# Patient Record
Sex: Female | Born: 1945 | Race: White | Hispanic: No | Smoking: Current every day smoker
Health system: Southern US, Community
[De-identification: ages and names within clinical notes are randomized; demographics above are authoritative.]

## PROBLEM LIST (undated history)

## (undated) DIAGNOSIS — N19 Unspecified kidney failure: Secondary | ICD-10-CM

## (undated) DIAGNOSIS — G4733 Obstructive sleep apnea (adult) (pediatric): Secondary | ICD-10-CM

## (undated) HISTORY — DX: Unspecified kidney failure: N19

## (undated) HISTORY — DX: Obstructive sleep apnea (adult) (pediatric): G47.33

---

## 2003-03-30 ENCOUNTER — Ambulatory Visit (HOSPITAL_COMMUNITY): Admission: RE | Admit: 2003-03-30 | Discharge: 2003-03-30 | Payer: Self-pay | Admitting: Neurosurgery

## 2003-03-30 ENCOUNTER — Encounter: Payer: Self-pay | Admitting: Neurosurgery

## 2003-04-11 ENCOUNTER — Encounter: Payer: Self-pay | Admitting: Diagnostic Radiology

## 2003-04-11 ENCOUNTER — Encounter: Admission: RE | Admit: 2003-04-11 | Discharge: 2003-04-11 | Payer: Self-pay | Admitting: Neurosurgery

## 2003-04-11 ENCOUNTER — Encounter: Payer: Self-pay | Admitting: Neurosurgery

## 2003-07-17 ENCOUNTER — Inpatient Hospital Stay (HOSPITAL_COMMUNITY): Admission: RE | Admit: 2003-07-17 | Discharge: 2003-07-20 | Payer: Self-pay | Admitting: Neurosurgery

## 2003-10-23 ENCOUNTER — Inpatient Hospital Stay (HOSPITAL_COMMUNITY): Admission: RE | Admit: 2003-10-23 | Discharge: 2003-10-30 | Payer: Self-pay | Admitting: Neurosurgery

## 2004-07-09 ENCOUNTER — Ambulatory Visit: Payer: Self-pay | Admitting: Oncology

## 2004-08-21 ENCOUNTER — Ambulatory Visit (HOSPITAL_COMMUNITY): Admission: RE | Admit: 2004-08-21 | Discharge: 2004-08-21 | Payer: Self-pay | Admitting: Neurosurgery

## 2005-04-02 IMAGING — CT CT CERVICAL SPINE W/ CM
1 of 7 series · 3 of 14 positions shown, 4 images · non-contrast
Comparison: none

CLINICAL DATA: Neck pain.  
CERVICAL MYELOGRAM:
Lumbar puncture was performed by Dr. Medrano at approximately L4-5.  There was a mixed injection with subdural and subarachnoid contrast.  There was insufficient contrast maneuvered into the cervical region to allow for diagnostic myelography.

[Series 3: recon 2: cervical spine · axial · 0.23mm/px · z∈[-36,+114]mm · 3 of 61 slices shown, 4 images]
[im 1/61  soft-tissue]
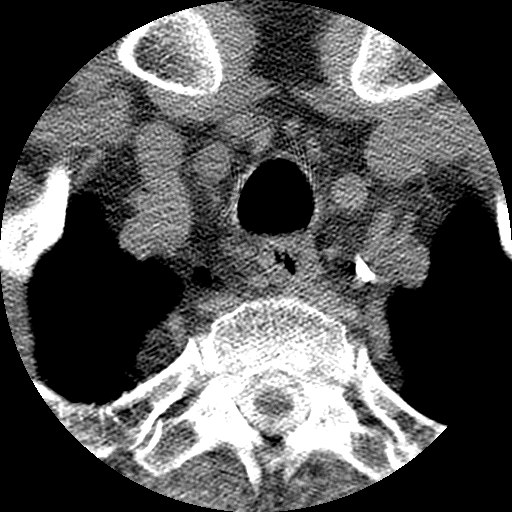
[im 1/61  bone]
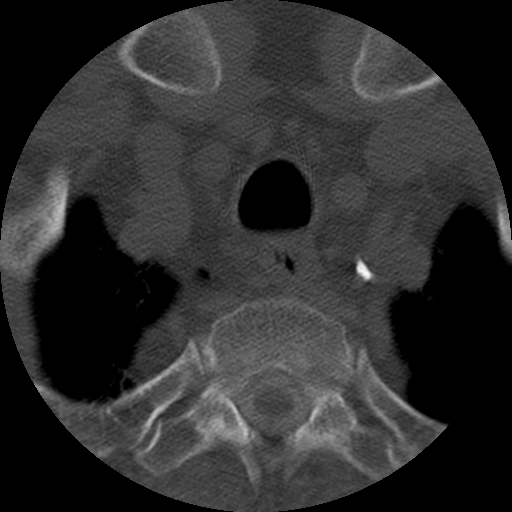
[im 31/61  bone]
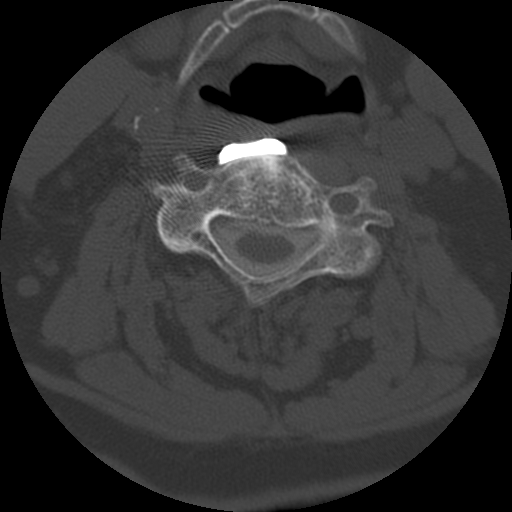
[im 61/61  bone]
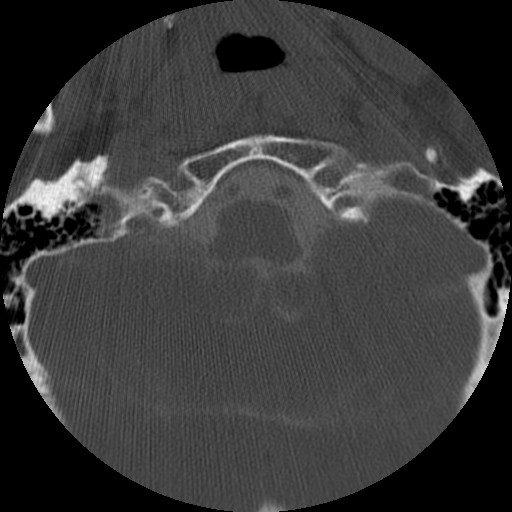

[3 of 14 positions shown; findings below may reference images not displayed]

IMPRESSION: As above. 
POST MYELOGRAM CT OF THE CERVICAL SPINE:
Hardware has been place anteriorly to augment several levels of fusion.  There is no apparent loosening of the screws.
C2-3:  Normal interspace. 
C3-4:  Incomplete fusion. Foraminal narrowing, left with left C4 nerve root encroachment.  
C4-5:  Solid fusion.  Bony foraminal narrowing bilaterally left greater than right with bilateral C5 nerve root encroachment worse on the left. 
C5-6:  Bony foraminal narrowing due to a combination of facet arthropathy, osteophyte formation.  Solid fusion is present. Bilateral C6 nerve root encroachment is present. 
C6-7:   Nonunion across the fusion site.  Bilateral uncinate hypertrophy results in foraminal narrowing with resultant C7 nerve root encroachment.  
C7-T1:  No stenosis or disk protrusion. 
Canal diameter is adequate at all levels without cord compression or spinal stenosis.
IMPRESSION: 1.   The dominant findings are nonunion at C3-4 and C6-7 despite anterior hardware placement.  
2.   Bony fusion is seen across the C4-5 and C5-6 interspaces.  
3.  There is multilevel bony foraminal narrowing due to uncinate hypertrophy and osteophyte formation as well as facet arthropathy; this is worst at C3-4 on the left, C4-5, C5-6 and C6-7 bilaterally.  
4.  There is no cord compression or significant soft disk protrusion.

## 2007-08-05 ENCOUNTER — Encounter: Admission: RE | Admit: 2007-08-05 | Discharge: 2007-08-05 | Payer: Self-pay

## 2010-08-30 ENCOUNTER — Encounter: Payer: Self-pay | Admitting: Neurosurgery

## 2010-12-26 NOTE — H&P (Signed)
NAMEPRESLEIGH, FELDSTEIN                           ACCOUNT NO.:  [id number]   MEDICAL RECORD NO.:  [id number]                   PATIENT TYPE:  INP   LOCATION:  3035                                 FACILITY:  [facility]   PHYSICIAN:  Hilda Lias, M.D.                DATE OF BIRTH:  18-Sep-1945   DATE OF ADMISSION:  07/17/2003  DATE OF DISCHARGE:                                HISTORY & PHYSICAL   Ms. Votaw is a lady who was in my office because of leg pain going from her  shoulder all the way down the right upper extremity associated with numbness  and tingling sensation.  This problem had been going on for at least three  years since she fell.  About 10 years ago, she underwent cervical diskectomy  by a neurosurgeon in [facility].  She feels that she is getting worse.  She  has had conservative treatment.  Because of no improvement, she wants to  proceed with surgery.   PAST MEDICAL HISTORY:  1. She had two lumbar disks removed in [place], [place].  2. Neck surgery in [place], [place].  3. C section.   ALLERGIES:  DAYPRO and TYLENOL.   SOCIAL HISTORY:  She smokes two packs a day.  She does not drink.   FAMILY HISTORY:  Unremarkable.   REVIEW OF SYSTEMS:  Positive for headache, leg pain, shortness of breath,  urinary incontinence, arm weakness, leg weakness, neck pain.   PHYSICAL EXAMINATION:  GENERAL:  The patient came to my office, and she was  in no acute distress.  HEENT:  Normal.  NECK:  Scar on the right side of the neck.  She is able to flex. Extension  causes pain because of restriction  LUNGS:  Bilateral rhonchi.  HEART:  Sounds normal.  ABDOMEN:  Normal.  EXTREMITIES:  Normal pulses.  NEUROLOGIC:  Mental status normal.  Cranial nerves normal.  Strength: She  has weakness in dorsiflexion in both feet.  Sensation is normal.   LABORATORY DATA:  The patient had cervical myelogram which showed a case of  degenerative disk disease at the level of 4-5 and  6-7.   The last time I saw her, she was having quite a bit of worsening of pain up  to the point where she said she wanted to proceed with surgery.  The  procedure will be a 3-4, 4-5, and 6-7 diskectomy with fusion using bone  graft, allograft, plus a plate.  The patient prior to surgery had an EMG  nerve conduction test which showed chronic C5 and C6 radiculopathy.   CLINICAL IMPRESSION:  C3-4, 5-6, 6-7 degenerative disk disease with a  chronic radiculopathy.  She is status post lumbar surgery two times in  [place], [place].   RECOMMENDATIONS:  I talked to her, and she wants to proceed with the  surgery.  Because she had surgery on the  right side of the neck, I sent her  to be seen by ENT.  According to her test, her bronchial cords were  completely normal.  Nevertheless, the risks associated with surgery are  paralysis of the vocal cord, infection, CSF leak, damage to vertebral  artery, damage to the esophagus, damage to the trachea, and need for further  surgery.                                                Hilda Lias, M.D.    EB/MEDQ  D:  07/17/2003  T:  07/17/2003  Job:  [id number]

## 2010-12-26 NOTE — Op Note (Signed)
[name], [name]                           ACCOUNT NO.:  [id number]   MEDICAL RECORD NO.:  [id number]                   PATIENT TYPE:  INP   LOCATION:  3011                                 FACILITY:  [facility]   PHYSICIAN:  Hilda Lias, M.D.                DATE OF BIRTH:  1946-07-01   DATE OF PROCEDURE:  10/23/2003  DATE OF DISCHARGE:                                 OPERATIVE REPORT   PREOPERATIVE DIAGNOSES:  Degenerative disk disease, L4-5 with bilateral  radiculopathy, stenosis L5-S1, status post three lumbar laminectomies in  [place], [place].   POSTOPERATIVE DIAGNOSES:  Degenerative disk disease, L4-5 with bilateral  radiculopathy, stenosis L5-S1, status post three lumbar laminectomies in  [place], [place].   OPERATION PERFORMED:  Bilateral L4 on L5 laminectomy, bilateral total gross  L4-5 diskectomy, interbody fusion with auto and allograft, pedicle screw for  L4-L5, posterolateral fusion L4, L5, S1 autograft, allograft.  C-arm.  Cell  Saver.   SURGEON:  Hilda Lias, M.D.   ASSISTANT:  Clydene Fake, M.D.   ANESTHESIA:  General.   INDICATIONS FOR PROCEDURE:  [name] is a lady who last year underwent  anterior cervical diskectomy.  She had two procedures in [facility].  Also  she had been complaining of back pain and the patient had three lumbar  laminectomies  in [place], [place].  Also she had weakening of both  legs.  The pain was getting worse.  She wanted to have surgery back in  [place] and I agreed with that.  Nevertheless, we scheduled her for last  month.  She cancelled and now she called back because she was getting worse.  She has a 2/5 weakness for dorsiflexion.  X-ray showed a calcified disk at  the level 5-1 and degenerative arthropathy with stenosis at the L4-5.  The  risks were explained in the history and physical.   DESCRIPTION OF PROCEDURE:  The patient was taken to the operating room and  she was positioned in a  prone manner.  A previous scar tissue was removed.  Muscle and fascia were retracted laterally.  By x-ray, we identified L4-L5  and L5-S1.  Indeed the L5-S1 was calcified.  It was quite difficult to see  in the C-arm and we went ahead and did a plain x-ray.  Then with a Leksell  we removed the spinous process of L4-L5.  We did bilateral laminectomies and  decompressed the L5 nerve root.  Then we identified the L4-5 herniated disk.  The patient had quite a bit of scar tissue in the left side.  Lysis was  accomplished and we were able to decompress the L4 and L5 nerve root  bilaterally.  We entered the disk space.  Total diskectomy was made and  using curet, the end plates were removed.  A piece of allograft 12 x 24 was  inserted bilaterally and in the midline  as well as posterolaterally, auto  and allograft was used to fill up the space.  Then we with the C-arm, we  identified the spinous process, the pedicle of L4 on L5.  Pedicle probe was  inserted followed by a tap.  Then four pedicle screws of 6.5 x 45 were  introduced.  AP and lateral view showed good position of the pedicle screws.  Then we went laterally and we removed the periosteum of L4-L5, transverse  processes, as well as the ala of the sacrum.  A mixture of auto and  allograft was used to fill up this space.  Then rods  were used to connect to the pedicle screws and caps were used.  The  __________ showed good position.  Investigation of the L4, L5 and S1 nerve  roots was normal with plenty of space.  From then on, Valsalva maneuver was  negative.  The area was irrigated and the wound was closed with Vicryl and  nylon.                                               Hilda Lias, M.D.    EB/MEDQ  D:  10/23/2003  T:  10/24/2003  Job:  [id number]

## 2010-12-26 NOTE — Op Note (Signed)
NAMEJAMEL, [name]                           ACCOUNT NO.:  [id number]   MEDICAL RECORD NO.:  [id number]                   PATIENT TYPE:  INP   LOCATION:  3035                                 FACILITY:  [facility]   PHYSICIAN:  Hilda Lias, M.D.                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   PROCEDURE:  Anterior 3-4, 4-5, and 6-7 diskectomy, decompression of the  spinal cord, bilateral foraminotomy, interbody fusion with allograft plate  from C3 down to C5 and from C6 to C7.  Microscope.   SURGEON:  Hilda Lias, M.D.   ASSISTANT:  Clydene Fake, M.D.   INDICATIONS FOR PROCEDURE:  The patient was admitted because of neck pain  with radiation to the left upper extremity for several months.  The patient  previously had surgery in her neck in [place] at the level of 5-6.  Also  she had three or four back interventions.  Myelogram showed progressive  spondylosis mostly at the level of 3-4, 4-5, and 6-7, worse to the left  side.  The patient wanted to proceed with surgery.  The risks were explained  in the history and physical.  The patient is a heavy smoker.   DESCRIPTION OF PROCEDURE:  The patient was taken to the ER and we decided to  go ahead through the right side. The reason was because she had surgery from  the right side before and although she told us that she was seen by ENT  which found her vocal cord normal, I never got the report.  To avoid any  worsening of any previous vocal cord paralysis, I decided to go to the right  side.  A longitudinal incision was made through the skin and  platysma down  to the cervical area.  X-ray showed that indeed we were at the level of 4-5  and C6-7.  Then we opened the anterior ligament at the level of 3-4 and 4-5.  The patient had quite a bit of anterior osteophytes which were removed.  The  same procedure was done at the level of C6 and C7.  We brought the  microscope into the area and we found quite a bit of degenerative disk with  stenosis bilaterally worse going to the left side.  Using the 1, 2, and 3 mm  Kerrison punch, decompression of the spinal cord as well as the foramen was  done.  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  Nevertheless, we decompressed the  spinal cord and the nerve root.  Then two pieces of bone graft of 7 mm was  applied at the level of 3-4 and 4-5 followed by a plate using six screws.  At the level of C6-7 because the bone was osteoporotic, the bone graft was  about 10 mm height.  This was followed by a plate using four screws.  The  area was irrigated.  Lateral cervical spine showed that the upper plate was  normal.  It was difficult to see the lower part, although, the upper part of  the plate at the level of C6 was within normal limits.  The area was  irrigated, but because of her history of smoking and COPD, we decided to put  in a drain.  The wound was closed with Vicryl and Steri-Strips.                                               Hilda Lias, M.D.   EB/MEDQ  D:  07/17/2003  T:  07/18/2003  Job:  [id number]

## 2010-12-26 NOTE — H&P (Signed)
[name], [name]                           ACCOUNT NO.:  [id number]   MEDICAL RECORD NO.:  [id number]                   PATIENT TYPE:  INP   LOCATION:  3011                                 FACILITY:  [facility]   PHYSICIAN:  Hilda Lias, M.D.                DATE OF BIRTH:  12-06-1945   DATE OF ADMISSION:  10/23/2003  DATE OF DISCHARGE:                                HISTORY & PHYSICAL   HISTORY OF PRESENT ILLNESS:  [name] is a lady who was taken to surgery  by me for a anterior cervical diskectomy several months ago.  This patient,  in the past, has had 3 lumbar diskectomies in [place], [place].  Before and after her neck surgery, she continues to have back pain with  radiation down to both lower extremities and she was quite miserable.  Neck-  wise, she is doing great, but now she feels that the pain in both legs is  getting worse.  We scheduled her for surgery at her request back in  February, although we advised her that since she already had surgery in  [place], it would be really worthwhile to be seen by the surgeon in  [place].  Nevertheless, she cancelled her surgery in February and then she  tried to go back to work, the pain got worse and she decided to go ahead and  proceed with the lumbar fusion.  This procedure was offered to her by the  surgeon in [place].   PAST MEDICAL HISTORY:  1. She had anterior lumbar diskectomies.  2. She had neck surgery in 1992 and also in 2004 by [place].  3. The patient has a history of C-section.   ALLERGIES:  She is allergic to DAYPRO, CODEINE and TYLENOL.   SOCIAL HISTORY:  She smokes 2 packs a day.  She does not drink.   FAMILY HISTORY:  Family history unremarkable.   REVIEW OF SYSTEMS:  Review of systems positive for leg pain, sinus headache,  asthma, shortness of breath, urinary incontinence, arm weakness, leg  weakness, arthritis.   PHYSICAL EXAMINATION:  HEENT:  Head, eyes, ears, nose and throat normal.  NECK:   She has a scar from previous surgery anteriorly.  LUNGS:  There are some rhonchi bilaterally.  HEART:  Heart sounds normal.  ABDOMEN:  Normal.  EXTREMITIES:  Normal pulses.  NEUROLOGIC:  Mental status normal.  Cranial nerves normal.  The patient has  2/5 weakness on dorsiflexion of the left foot.  Reflexes are symmetrical  with decrease at the left ankle jerk.  She has some numbness in both feet.  The straight leg raising -- SLR -- is positive bilaterally at 45 degrees and  she has weakness of the left foot being 2/5.   IMAGING STUDIES:  The lumbar x-ray shows some degenerative disk disease at  the level of L4-5.   The MRI shows a severe case of degenerative  disk disease with bilateral  facet arthropathy, 4-5, right worse than the left one.   CLINICAL IMPRESSION:  1. Chronic back pain with status post three lumbar diskectomies.  2. Degenerative disease at 4-5 with a chronic L5 radiculopathy.   RECOMMENDATION:  The patient wants to proceed with surgery.  The procedure  will be an L4-5 diskectomy, interbody fusion, posterolateral fusion and  pedicle screws.  She knows about the risks such as no improvement whatsoever  because of the chronicity of the pain and the chronicity of the weakness and  CSF leak, failure of the fusion because of the history of heavy smoking,  damage to the vessels of the abdomen, damage or problems with bladder and  bowel and need for further surgery.                                                Hilda Lias, M.D.    EB/MEDQ  D:  10/23/2003  T:  10/24/2003  Job:  [id number]

## 2010-12-26 NOTE — Discharge Summary (Signed)
NAMEKRISTLE, [name]                           ACCOUNT NO.:  [id number]   MEDICAL RECORD NO.:  [id number]                   PATIENT TYPE:  INP   LOCATION:  3035                                 FACILITY:  [facility]   PHYSICIAN:  Hilda Lias, M.D.                DATE OF BIRTH:  07-09-1946   DATE OF ADMISSION:  07/17/2003  DATE OF DISCHARGE:  07/20/2003                                 DISCHARGE SUMMARY   ADMISSION DIAGNOSES:  1. C3-4, C4-5, and C6-7 spondylosis.  2. Chronic obstructive pulmonary disease.   FINAL DIAGNOSES:  1. C3-4, C4-5, and C6-7 spondylosis.  2. Chronic obstructive pulmonary disease.   HISTORY:  The patient was admitted because of neck pain with radiation to  the left lower extremity.  In the past, he has had fusion at the level of 5-  6.  X-rays show diffuse spondylosis of 3-4, 4-5, and 6-7.  Surgery was  advised.   LABORATORY DATA:  Normal.   HOSPITAL COURSE:  The patient was taken to surgery and a decompression and  fusion at those three levels was made.  Because of the previous surgery we  did this surgery through the right side, and a drain was left in for 48  hours.  Today, she is doing much better.  The patient is a heavy smoker, and  the oxygen saturations have been 86 to 92.  Today, she is ambulating, she  has no shortness of breath, she is able to swallow, and she feels that she  is ready to go home.   CONDITION ON DISCHARGE:  Improved.   DISCHARGE MEDICATIONS:  1. Percocet.  2. Flexeril.   She is to continue taking her medications for her lungs, and she was advised  to be seen by her medical physician for her lungs in the next 10 days.   DIET:  Regular.   ACTIVITY:  She is not to drive for at least a week.   FOLLOWUP:  I will see her in my office in four weeks.   Any questions, she is going to call me anytime.                                                Hilda Lias, M.D.    EB/MEDQ  D:  07/20/2003  T:  07/21/2003  Job:  [id number]

## 2010-12-26 NOTE — Discharge Summary (Signed)
[name], [name]                           ACCOUNT NO.:  [id number]   MEDICAL RECORD NO.:  [id number]                   PATIENT TYPE:  INP   LOCATION:  3011                                 FACILITY:  [facility]   PHYSICIAN:  Hilda Lias, M.D.                DATE OF BIRTH:  19-May-1946   DATE OF ADMISSION:  10/23/2003  DATE OF DISCHARGE:  10/25/2003                                 DISCHARGE SUMMARY   ADMISSION DIAGNOSIS:  L4-5 degenerative disk disease with chronic L4-L5  radiculopathy, status post 2 lumbar diskectomies.   POSTOPERATIVE DIAGNOSES:  1. L4-5 degenerative disk disease with chronic L4-L5 radiculopathy, status     post 2 lumbar diskectomies.  2. Pneumonia.   CLINICAL HISTORY:  The patient was admitted because of back pain with  radiation down to both legs.  She has chronic weakness with both  dorsiflexion.  The patient elsewhere had 2 lumbar diskectomies.  X-rays  showed degenerative disk disease at 4-5.  The patient wanted to proceed with  surgery.  The patient is a heavy smoker.   LABORATORY DATA:  Laboratory within normal limits except for the hemoglobin  is 10.0.  The white cells went from 16.1 to 7.1.   COURSE IN THE HOSPITAL:  The patient was taken to surgery and a L4-5 fusion  was done.  After surgery, the patient was drowsy and was sleepy.  She had  quite a bit of secretions.  She had a low-grade fever.  X-rays showed that  she had bilateral pneumonia.  She was seen by Dr. Hollice Espy from  the [facility]  The patient was started on antibiotics.  Today, she is feeling much better, she has been ambulating, she has been  afebrile and she wants to go home.   CONDITION ON DISCHARGE:  Improving.   MEDICATIONS:  Percocet, Flexeril and Avelox.   DIET:  She was advised to continue losing weight.   ACTIVITY:  Not to drive and not to do any heavy lifting.   FOLLOWUP:  I will be seeing her in 4 weeks or as needed.  She also was  encouraged  to call her regular doctor, Dr. Lucila Maine, in [place].  If  she has any questions in getting an appointment with him, she is going to  give me a call so I can get one of the pulmonologists to see her here in  [place].                                                Hilda Lias, M.D.    EB/MEDQ  D:  10/30/2003  T:  11/01/2003  Job:  [id number]

## 2015-04-13 DIAGNOSIS — J9601 Acute respiratory failure with hypoxia: Secondary | ICD-10-CM | POA: Diagnosis not present

## 2015-04-13 DIAGNOSIS — J180 Bronchopneumonia, unspecified organism: Secondary | ICD-10-CM | POA: Diagnosis not present

## 2015-04-13 DIAGNOSIS — E860 Dehydration: Secondary | ICD-10-CM | POA: Diagnosis not present

## 2015-04-13 DIAGNOSIS — J441 Chronic obstructive pulmonary disease with (acute) exacerbation: Secondary | ICD-10-CM | POA: Diagnosis not present

## 2015-04-16 DIAGNOSIS — Z6827 Body mass index (BMI) 27.0-27.9, adult: Secondary | ICD-10-CM | POA: Diagnosis not present

## 2015-04-16 DIAGNOSIS — J069 Acute upper respiratory infection, unspecified: Secondary | ICD-10-CM | POA: Diagnosis not present

## 2015-04-16 DIAGNOSIS — H6691 Otitis media, unspecified, right ear: Secondary | ICD-10-CM | POA: Diagnosis not present

## 2015-04-30 DIAGNOSIS — H6691 Otitis media, unspecified, right ear: Secondary | ICD-10-CM | POA: Diagnosis not present

## 2015-04-30 DIAGNOSIS — Z6828 Body mass index (BMI) 28.0-28.9, adult: Secondary | ICD-10-CM | POA: Diagnosis not present

## 2015-05-07 DIAGNOSIS — F1120 Opioid dependence, uncomplicated: Secondary | ICD-10-CM | POA: Diagnosis not present

## 2015-05-07 DIAGNOSIS — M961 Postlaminectomy syndrome, not elsewhere classified: Secondary | ICD-10-CM | POA: Diagnosis not present

## 2015-05-07 DIAGNOSIS — S32008A Other fracture of unspecified lumbar vertebra, initial encounter for closed fracture: Secondary | ICD-10-CM | POA: Diagnosis not present

## 2015-05-07 DIAGNOSIS — Z720 Tobacco use: Secondary | ICD-10-CM | POA: Diagnosis not present

## 2015-05-07 DIAGNOSIS — G894 Chronic pain syndrome: Secondary | ICD-10-CM | POA: Diagnosis not present

## 2015-05-07 DIAGNOSIS — Q761 Klippel-Feil syndrome: Secondary | ICD-10-CM | POA: Diagnosis not present

## 2015-05-09 DIAGNOSIS — H6523 Chronic serous otitis media, bilateral: Secondary | ICD-10-CM | POA: Diagnosis not present

## 2015-05-09 DIAGNOSIS — Z6828 Body mass index (BMI) 28.0-28.9, adult: Secondary | ICD-10-CM | POA: Diagnosis not present

## 2015-05-13 DIAGNOSIS — J9601 Acute respiratory failure with hypoxia: Secondary | ICD-10-CM | POA: Diagnosis not present

## 2015-05-13 DIAGNOSIS — J180 Bronchopneumonia, unspecified organism: Secondary | ICD-10-CM | POA: Diagnosis not present

## 2015-05-13 DIAGNOSIS — J441 Chronic obstructive pulmonary disease with (acute) exacerbation: Secondary | ICD-10-CM | POA: Diagnosis not present

## 2015-05-13 DIAGNOSIS — E860 Dehydration: Secondary | ICD-10-CM | POA: Diagnosis not present

## 2015-05-22 DIAGNOSIS — H8110 Benign paroxysmal vertigo, unspecified ear: Secondary | ICD-10-CM | POA: Diagnosis not present

## 2015-05-22 DIAGNOSIS — J342 Deviated nasal septum: Secondary | ICD-10-CM | POA: Diagnosis not present

## 2015-05-22 DIAGNOSIS — J3489 Other specified disorders of nose and nasal sinuses: Secondary | ICD-10-CM | POA: Diagnosis not present

## 2015-05-22 DIAGNOSIS — R42 Dizziness and giddiness: Secondary | ICD-10-CM | POA: Diagnosis not present

## 2015-05-22 DIAGNOSIS — H9193 Unspecified hearing loss, bilateral: Secondary | ICD-10-CM | POA: Diagnosis not present

## 2015-05-29 DIAGNOSIS — J449 Chronic obstructive pulmonary disease, unspecified: Secondary | ICD-10-CM | POA: Diagnosis not present

## 2015-05-29 DIAGNOSIS — Z01818 Encounter for other preprocedural examination: Secondary | ICD-10-CM | POA: Diagnosis not present

## 2015-05-29 DIAGNOSIS — N055 Unspecified nephritic syndrome with diffuse mesangiocapillary glomerulonephritis: Secondary | ICD-10-CM | POA: Diagnosis not present

## 2015-05-29 DIAGNOSIS — N183 Chronic kidney disease, stage 3 (moderate): Secondary | ICD-10-CM | POA: Diagnosis not present

## 2015-05-29 DIAGNOSIS — Z23 Encounter for immunization: Secondary | ICD-10-CM | POA: Diagnosis not present

## 2015-06-06 DIAGNOSIS — M961 Postlaminectomy syndrome, not elsewhere classified: Secondary | ICD-10-CM | POA: Diagnosis not present

## 2015-06-06 DIAGNOSIS — Q761 Klippel-Feil syndrome: Secondary | ICD-10-CM | POA: Diagnosis not present

## 2015-06-06 DIAGNOSIS — Z720 Tobacco use: Secondary | ICD-10-CM | POA: Diagnosis not present

## 2015-06-06 DIAGNOSIS — F1120 Opioid dependence, uncomplicated: Secondary | ICD-10-CM | POA: Diagnosis not present

## 2015-06-06 DIAGNOSIS — S32008A Other fracture of unspecified lumbar vertebra, initial encounter for closed fracture: Secondary | ICD-10-CM | POA: Diagnosis not present

## 2015-06-06 DIAGNOSIS — G894 Chronic pain syndrome: Secondary | ICD-10-CM | POA: Diagnosis not present

## 2015-06-12 DIAGNOSIS — J3489 Other specified disorders of nose and nasal sinuses: Secondary | ICD-10-CM | POA: Diagnosis not present

## 2015-06-12 DIAGNOSIS — Z79899 Other long term (current) drug therapy: Secondary | ICD-10-CM | POA: Diagnosis not present

## 2015-06-12 DIAGNOSIS — J343 Hypertrophy of nasal turbinates: Secondary | ICD-10-CM | POA: Diagnosis not present

## 2015-06-12 DIAGNOSIS — Z7982 Long term (current) use of aspirin: Secondary | ICD-10-CM | POA: Diagnosis not present

## 2015-06-12 DIAGNOSIS — J342 Deviated nasal septum: Secondary | ICD-10-CM | POA: Diagnosis not present

## 2015-06-12 DIAGNOSIS — J45909 Unspecified asthma, uncomplicated: Secondary | ICD-10-CM | POA: Diagnosis not present

## 2015-06-13 DIAGNOSIS — J441 Chronic obstructive pulmonary disease with (acute) exacerbation: Secondary | ICD-10-CM | POA: Diagnosis not present

## 2015-06-13 DIAGNOSIS — E860 Dehydration: Secondary | ICD-10-CM | POA: Diagnosis not present

## 2015-06-13 DIAGNOSIS — J180 Bronchopneumonia, unspecified organism: Secondary | ICD-10-CM | POA: Diagnosis not present

## 2015-06-13 DIAGNOSIS — J9601 Acute respiratory failure with hypoxia: Secondary | ICD-10-CM | POA: Diagnosis not present

## 2015-06-17 DIAGNOSIS — Z1231 Encounter for screening mammogram for malignant neoplasm of breast: Secondary | ICD-10-CM | POA: Diagnosis not present

## 2015-06-17 DIAGNOSIS — Z1211 Encounter for screening for malignant neoplasm of colon: Secondary | ICD-10-CM | POA: Diagnosis not present

## 2015-06-17 DIAGNOSIS — Z0000 Encounter for general adult medical examination without abnormal findings: Secondary | ICD-10-CM | POA: Diagnosis not present

## 2015-06-17 DIAGNOSIS — Z01419 Encounter for gynecological examination (general) (routine) without abnormal findings: Secondary | ICD-10-CM | POA: Diagnosis not present

## 2015-06-26 DIAGNOSIS — N055 Unspecified nephritic syndrome with diffuse mesangiocapillary glomerulonephritis: Secondary | ICD-10-CM | POA: Diagnosis not present

## 2015-06-26 DIAGNOSIS — Z79899 Other long term (current) drug therapy: Secondary | ICD-10-CM | POA: Diagnosis not present

## 2015-06-26 DIAGNOSIS — I119 Hypertensive heart disease without heart failure: Secondary | ICD-10-CM | POA: Diagnosis not present

## 2015-06-26 DIAGNOSIS — Z1389 Encounter for screening for other disorder: Secondary | ICD-10-CM | POA: Diagnosis not present

## 2015-06-26 DIAGNOSIS — N183 Chronic kidney disease, stage 3 (moderate): Secondary | ICD-10-CM | POA: Diagnosis not present

## 2015-06-26 DIAGNOSIS — Z139 Encounter for screening, unspecified: Secondary | ICD-10-CM | POA: Diagnosis not present

## 2015-06-26 DIAGNOSIS — Z0000 Encounter for general adult medical examination without abnormal findings: Secondary | ICD-10-CM | POA: Diagnosis not present

## 2015-07-03 DIAGNOSIS — F1120 Opioid dependence, uncomplicated: Secondary | ICD-10-CM | POA: Diagnosis not present

## 2015-07-03 DIAGNOSIS — M961 Postlaminectomy syndrome, not elsewhere classified: Secondary | ICD-10-CM | POA: Diagnosis not present

## 2015-07-03 DIAGNOSIS — S32008A Other fracture of unspecified lumbar vertebra, initial encounter for closed fracture: Secondary | ICD-10-CM | POA: Diagnosis not present

## 2015-07-03 DIAGNOSIS — G894 Chronic pain syndrome: Secondary | ICD-10-CM | POA: Diagnosis not present

## 2015-07-03 DIAGNOSIS — Z720 Tobacco use: Secondary | ICD-10-CM | POA: Diagnosis not present

## 2015-07-03 DIAGNOSIS — Q761 Klippel-Feil syndrome: Secondary | ICD-10-CM | POA: Diagnosis not present

## 2015-07-08 DIAGNOSIS — Z1231 Encounter for screening mammogram for malignant neoplasm of breast: Secondary | ICD-10-CM | POA: Diagnosis not present

## 2015-07-08 DIAGNOSIS — M7062 Trochanteric bursitis, left hip: Secondary | ICD-10-CM | POA: Diagnosis not present

## 2015-07-08 DIAGNOSIS — Z6830 Body mass index (BMI) 30.0-30.9, adult: Secondary | ICD-10-CM | POA: Diagnosis not present

## 2015-07-13 DIAGNOSIS — E860 Dehydration: Secondary | ICD-10-CM | POA: Diagnosis not present

## 2015-07-13 DIAGNOSIS — J441 Chronic obstructive pulmonary disease with (acute) exacerbation: Secondary | ICD-10-CM | POA: Diagnosis not present

## 2015-07-13 DIAGNOSIS — J9601 Acute respiratory failure with hypoxia: Secondary | ICD-10-CM | POA: Diagnosis not present

## 2015-07-13 DIAGNOSIS — J180 Bronchopneumonia, unspecified organism: Secondary | ICD-10-CM | POA: Diagnosis not present

## 2015-07-19 DIAGNOSIS — Z1231 Encounter for screening mammogram for malignant neoplasm of breast: Secondary | ICD-10-CM | POA: Diagnosis not present

## 2015-07-29 DIAGNOSIS — J441 Chronic obstructive pulmonary disease with (acute) exacerbation: Secondary | ICD-10-CM | POA: Diagnosis not present

## 2015-07-29 DIAGNOSIS — N055 Unspecified nephritic syndrome with diffuse mesangiocapillary glomerulonephritis: Secondary | ICD-10-CM | POA: Diagnosis not present

## 2015-07-29 DIAGNOSIS — Z6827 Body mass index (BMI) 27.0-27.9, adult: Secondary | ICD-10-CM | POA: Diagnosis not present

## 2015-07-29 DIAGNOSIS — I119 Hypertensive heart disease without heart failure: Secondary | ICD-10-CM | POA: Diagnosis not present

## 2015-08-01 DIAGNOSIS — F1120 Opioid dependence, uncomplicated: Secondary | ICD-10-CM | POA: Diagnosis not present

## 2015-08-01 DIAGNOSIS — M961 Postlaminectomy syndrome, not elsewhere classified: Secondary | ICD-10-CM | POA: Diagnosis not present

## 2015-08-01 DIAGNOSIS — Q761 Klippel-Feil syndrome: Secondary | ICD-10-CM | POA: Diagnosis not present

## 2015-08-01 DIAGNOSIS — G894 Chronic pain syndrome: Secondary | ICD-10-CM | POA: Diagnosis not present

## 2015-08-01 DIAGNOSIS — Z720 Tobacco use: Secondary | ICD-10-CM | POA: Diagnosis not present

## 2015-08-01 DIAGNOSIS — S32008A Other fracture of unspecified lumbar vertebra, initial encounter for closed fracture: Secondary | ICD-10-CM | POA: Diagnosis not present

## 2015-08-13 DIAGNOSIS — J180 Bronchopneumonia, unspecified organism: Secondary | ICD-10-CM | POA: Diagnosis not present

## 2015-08-13 DIAGNOSIS — J441 Chronic obstructive pulmonary disease with (acute) exacerbation: Secondary | ICD-10-CM | POA: Diagnosis not present

## 2015-08-13 DIAGNOSIS — J9601 Acute respiratory failure with hypoxia: Secondary | ICD-10-CM | POA: Diagnosis not present

## 2015-08-13 DIAGNOSIS — E860 Dehydration: Secondary | ICD-10-CM | POA: Diagnosis not present

## 2015-08-29 DIAGNOSIS — J449 Chronic obstructive pulmonary disease, unspecified: Secondary | ICD-10-CM | POA: Diagnosis not present

## 2015-08-29 DIAGNOSIS — Z6830 Body mass index (BMI) 30.0-30.9, adult: Secondary | ICD-10-CM | POA: Diagnosis not present

## 2015-09-02 DIAGNOSIS — N390 Urinary tract infection, site not specified: Secondary | ICD-10-CM | POA: Diagnosis not present

## 2015-09-02 DIAGNOSIS — N183 Chronic kidney disease, stage 3 (moderate): Secondary | ICD-10-CM | POA: Diagnosis not present

## 2015-09-02 DIAGNOSIS — R809 Proteinuria, unspecified: Secondary | ICD-10-CM | POA: Diagnosis not present

## 2015-09-02 DIAGNOSIS — E559 Vitamin D deficiency, unspecified: Secondary | ICD-10-CM | POA: Diagnosis not present

## 2015-09-02 DIAGNOSIS — I10 Essential (primary) hypertension: Secondary | ICD-10-CM | POA: Diagnosis not present

## 2015-09-05 DIAGNOSIS — M961 Postlaminectomy syndrome, not elsewhere classified: Secondary | ICD-10-CM | POA: Diagnosis not present

## 2015-09-05 DIAGNOSIS — S32008A Other fracture of unspecified lumbar vertebra, initial encounter for closed fracture: Secondary | ICD-10-CM | POA: Diagnosis not present

## 2015-09-05 DIAGNOSIS — G894 Chronic pain syndrome: Secondary | ICD-10-CM | POA: Diagnosis not present

## 2015-09-05 DIAGNOSIS — Z720 Tobacco use: Secondary | ICD-10-CM | POA: Diagnosis not present

## 2015-09-05 DIAGNOSIS — Q761 Klippel-Feil syndrome: Secondary | ICD-10-CM | POA: Diagnosis not present

## 2015-09-05 DIAGNOSIS — F1120 Opioid dependence, uncomplicated: Secondary | ICD-10-CM | POA: Diagnosis not present

## 2015-09-12 DIAGNOSIS — R05 Cough: Secondary | ICD-10-CM | POA: Diagnosis not present

## 2015-09-12 DIAGNOSIS — Z6830 Body mass index (BMI) 30.0-30.9, adult: Secondary | ICD-10-CM | POA: Diagnosis not present

## 2015-09-12 DIAGNOSIS — J441 Chronic obstructive pulmonary disease with (acute) exacerbation: Secondary | ICD-10-CM | POA: Diagnosis not present

## 2015-09-13 DIAGNOSIS — J180 Bronchopneumonia, unspecified organism: Secondary | ICD-10-CM | POA: Diagnosis not present

## 2015-09-13 DIAGNOSIS — E860 Dehydration: Secondary | ICD-10-CM | POA: Diagnosis not present

## 2015-09-13 DIAGNOSIS — J441 Chronic obstructive pulmonary disease with (acute) exacerbation: Secondary | ICD-10-CM | POA: Diagnosis not present

## 2015-09-13 DIAGNOSIS — J9601 Acute respiratory failure with hypoxia: Secondary | ICD-10-CM | POA: Diagnosis not present

## 2015-09-26 DIAGNOSIS — Z6830 Body mass index (BMI) 30.0-30.9, adult: Secondary | ICD-10-CM | POA: Diagnosis not present

## 2015-09-26 DIAGNOSIS — F17200 Nicotine dependence, unspecified, uncomplicated: Secondary | ICD-10-CM | POA: Diagnosis not present

## 2015-09-26 DIAGNOSIS — J441 Chronic obstructive pulmonary disease with (acute) exacerbation: Secondary | ICD-10-CM | POA: Diagnosis not present

## 2015-10-01 DIAGNOSIS — M961 Postlaminectomy syndrome, not elsewhere classified: Secondary | ICD-10-CM | POA: Diagnosis not present

## 2015-10-01 DIAGNOSIS — S32008A Other fracture of unspecified lumbar vertebra, initial encounter for closed fracture: Secondary | ICD-10-CM | POA: Diagnosis not present

## 2015-10-01 DIAGNOSIS — G894 Chronic pain syndrome: Secondary | ICD-10-CM | POA: Diagnosis not present

## 2015-10-01 DIAGNOSIS — F1120 Opioid dependence, uncomplicated: Secondary | ICD-10-CM | POA: Diagnosis not present

## 2015-10-01 DIAGNOSIS — Z720 Tobacco use: Secondary | ICD-10-CM | POA: Diagnosis not present

## 2015-10-01 DIAGNOSIS — Q761 Klippel-Feil syndrome: Secondary | ICD-10-CM | POA: Diagnosis not present

## 2015-10-10 DIAGNOSIS — R0609 Other forms of dyspnea: Secondary | ICD-10-CM | POA: Diagnosis not present

## 2015-10-10 DIAGNOSIS — Z6831 Body mass index (BMI) 31.0-31.9, adult: Secondary | ICD-10-CM | POA: Diagnosis not present

## 2015-10-10 DIAGNOSIS — J449 Chronic obstructive pulmonary disease, unspecified: Secondary | ICD-10-CM | POA: Diagnosis not present

## 2015-10-10 DIAGNOSIS — F17200 Nicotine dependence, unspecified, uncomplicated: Secondary | ICD-10-CM | POA: Diagnosis not present

## 2015-10-11 DIAGNOSIS — J180 Bronchopneumonia, unspecified organism: Secondary | ICD-10-CM | POA: Diagnosis not present

## 2015-10-11 DIAGNOSIS — J441 Chronic obstructive pulmonary disease with (acute) exacerbation: Secondary | ICD-10-CM | POA: Diagnosis not present

## 2015-10-11 DIAGNOSIS — J9601 Acute respiratory failure with hypoxia: Secondary | ICD-10-CM | POA: Diagnosis not present

## 2015-10-11 DIAGNOSIS — E860 Dehydration: Secondary | ICD-10-CM | POA: Diagnosis not present

## 2015-10-29 DIAGNOSIS — M961 Postlaminectomy syndrome, not elsewhere classified: Secondary | ICD-10-CM | POA: Diagnosis not present

## 2015-10-29 DIAGNOSIS — S32008A Other fracture of unspecified lumbar vertebra, initial encounter for closed fracture: Secondary | ICD-10-CM | POA: Diagnosis not present

## 2015-10-29 DIAGNOSIS — F1120 Opioid dependence, uncomplicated: Secondary | ICD-10-CM | POA: Diagnosis not present

## 2015-10-29 DIAGNOSIS — G894 Chronic pain syndrome: Secondary | ICD-10-CM | POA: Diagnosis not present

## 2015-10-29 DIAGNOSIS — Q761 Klippel-Feil syndrome: Secondary | ICD-10-CM | POA: Diagnosis not present

## 2015-10-29 DIAGNOSIS — Z720 Tobacco use: Secondary | ICD-10-CM | POA: Diagnosis not present

## 2015-10-31 DIAGNOSIS — J449 Chronic obstructive pulmonary disease, unspecified: Secondary | ICD-10-CM | POA: Diagnosis not present

## 2015-10-31 DIAGNOSIS — Z6831 Body mass index (BMI) 31.0-31.9, adult: Secondary | ICD-10-CM | POA: Diagnosis not present

## 2015-10-31 DIAGNOSIS — R0609 Other forms of dyspnea: Secondary | ICD-10-CM | POA: Diagnosis not present

## 2015-11-01 DIAGNOSIS — R0600 Dyspnea, unspecified: Secondary | ICD-10-CM | POA: Diagnosis not present

## 2015-11-01 DIAGNOSIS — R0609 Other forms of dyspnea: Secondary | ICD-10-CM | POA: Diagnosis not present

## 2015-11-11 DIAGNOSIS — J441 Chronic obstructive pulmonary disease with (acute) exacerbation: Secondary | ICD-10-CM | POA: Diagnosis not present

## 2015-11-11 DIAGNOSIS — J9601 Acute respiratory failure with hypoxia: Secondary | ICD-10-CM | POA: Diagnosis not present

## 2015-11-11 DIAGNOSIS — E860 Dehydration: Secondary | ICD-10-CM | POA: Diagnosis not present

## 2015-11-11 DIAGNOSIS — J180 Bronchopneumonia, unspecified organism: Secondary | ICD-10-CM | POA: Diagnosis not present

## 2015-11-14 DIAGNOSIS — J449 Chronic obstructive pulmonary disease, unspecified: Secondary | ICD-10-CM | POA: Diagnosis not present

## 2015-11-14 DIAGNOSIS — I313 Pericardial effusion (noninflammatory): Secondary | ICD-10-CM | POA: Diagnosis not present

## 2015-11-14 DIAGNOSIS — N055 Unspecified nephritic syndrome with diffuse mesangiocapillary glomerulonephritis: Secondary | ICD-10-CM | POA: Diagnosis not present

## 2015-11-14 DIAGNOSIS — I119 Hypertensive heart disease without heart failure: Secondary | ICD-10-CM | POA: Diagnosis not present

## 2015-11-26 DIAGNOSIS — M961 Postlaminectomy syndrome, not elsewhere classified: Secondary | ICD-10-CM | POA: Diagnosis not present

## 2015-11-26 DIAGNOSIS — Z720 Tobacco use: Secondary | ICD-10-CM | POA: Diagnosis not present

## 2015-11-26 DIAGNOSIS — S32008A Other fracture of unspecified lumbar vertebra, initial encounter for closed fracture: Secondary | ICD-10-CM | POA: Diagnosis not present

## 2015-11-26 DIAGNOSIS — G894 Chronic pain syndrome: Secondary | ICD-10-CM | POA: Diagnosis not present

## 2015-11-26 DIAGNOSIS — F1120 Opioid dependence, uncomplicated: Secondary | ICD-10-CM | POA: Diagnosis not present

## 2015-11-26 DIAGNOSIS — Q761 Klippel-Feil syndrome: Secondary | ICD-10-CM | POA: Diagnosis not present

## 2015-12-04 DIAGNOSIS — I313 Pericardial effusion (noninflammatory): Secondary | ICD-10-CM | POA: Diagnosis not present

## 2015-12-04 DIAGNOSIS — R918 Other nonspecific abnormal finding of lung field: Secondary | ICD-10-CM | POA: Diagnosis not present

## 2015-12-04 DIAGNOSIS — J40 Bronchitis, not specified as acute or chronic: Secondary | ICD-10-CM | POA: Diagnosis not present

## 2015-12-05 DIAGNOSIS — J449 Chronic obstructive pulmonary disease, unspecified: Secondary | ICD-10-CM | POA: Diagnosis not present

## 2015-12-05 DIAGNOSIS — R911 Solitary pulmonary nodule: Secondary | ICD-10-CM | POA: Diagnosis not present

## 2015-12-11 DIAGNOSIS — E860 Dehydration: Secondary | ICD-10-CM | POA: Diagnosis not present

## 2015-12-11 DIAGNOSIS — J441 Chronic obstructive pulmonary disease with (acute) exacerbation: Secondary | ICD-10-CM | POA: Diagnosis not present

## 2015-12-11 DIAGNOSIS — J180 Bronchopneumonia, unspecified organism: Secondary | ICD-10-CM | POA: Diagnosis not present

## 2015-12-11 DIAGNOSIS — J9601 Acute respiratory failure with hypoxia: Secondary | ICD-10-CM | POA: Diagnosis not present

## 2015-12-31 DIAGNOSIS — M961 Postlaminectomy syndrome, not elsewhere classified: Secondary | ICD-10-CM | POA: Diagnosis not present

## 2015-12-31 DIAGNOSIS — F1120 Opioid dependence, uncomplicated: Secondary | ICD-10-CM | POA: Diagnosis not present

## 2015-12-31 DIAGNOSIS — S32008A Other fracture of unspecified lumbar vertebra, initial encounter for closed fracture: Secondary | ICD-10-CM | POA: Diagnosis not present

## 2015-12-31 DIAGNOSIS — G894 Chronic pain syndrome: Secondary | ICD-10-CM | POA: Diagnosis not present

## 2015-12-31 DIAGNOSIS — Z720 Tobacco use: Secondary | ICD-10-CM | POA: Diagnosis not present

## 2015-12-31 DIAGNOSIS — Q761 Klippel-Feil syndrome: Secondary | ICD-10-CM | POA: Diagnosis not present

## 2016-01-07 DIAGNOSIS — J449 Chronic obstructive pulmonary disease, unspecified: Secondary | ICD-10-CM | POA: Diagnosis not present

## 2016-01-07 DIAGNOSIS — J441 Chronic obstructive pulmonary disease with (acute) exacerbation: Secondary | ICD-10-CM | POA: Diagnosis not present

## 2016-01-07 DIAGNOSIS — J180 Bronchopneumonia, unspecified organism: Secondary | ICD-10-CM | POA: Diagnosis not present

## 2016-01-07 DIAGNOSIS — J9601 Acute respiratory failure with hypoxia: Secondary | ICD-10-CM | POA: Diagnosis not present

## 2016-01-11 DIAGNOSIS — J441 Chronic obstructive pulmonary disease with (acute) exacerbation: Secondary | ICD-10-CM | POA: Diagnosis not present

## 2016-01-11 DIAGNOSIS — E860 Dehydration: Secondary | ICD-10-CM | POA: Diagnosis not present

## 2016-01-11 DIAGNOSIS — J9601 Acute respiratory failure with hypoxia: Secondary | ICD-10-CM | POA: Diagnosis not present

## 2016-01-11 DIAGNOSIS — J180 Bronchopneumonia, unspecified organism: Secondary | ICD-10-CM | POA: Diagnosis not present

## 2016-01-28 DIAGNOSIS — M961 Postlaminectomy syndrome, not elsewhere classified: Secondary | ICD-10-CM | POA: Diagnosis not present

## 2016-01-28 DIAGNOSIS — Z720 Tobacco use: Secondary | ICD-10-CM | POA: Diagnosis not present

## 2016-01-28 DIAGNOSIS — S32008A Other fracture of unspecified lumbar vertebra, initial encounter for closed fracture: Secondary | ICD-10-CM | POA: Diagnosis not present

## 2016-01-28 DIAGNOSIS — Z1389 Encounter for screening for other disorder: Secondary | ICD-10-CM | POA: Diagnosis not present

## 2016-01-28 DIAGNOSIS — G894 Chronic pain syndrome: Secondary | ICD-10-CM | POA: Diagnosis not present

## 2016-01-28 DIAGNOSIS — Q761 Klippel-Feil syndrome: Secondary | ICD-10-CM | POA: Diagnosis not present

## 2016-01-28 DIAGNOSIS — F1120 Opioid dependence, uncomplicated: Secondary | ICD-10-CM | POA: Diagnosis not present

## 2016-02-04 DIAGNOSIS — J449 Chronic obstructive pulmonary disease, unspecified: Secondary | ICD-10-CM | POA: Diagnosis not present

## 2016-02-04 DIAGNOSIS — Z6833 Body mass index (BMI) 33.0-33.9, adult: Secondary | ICD-10-CM | POA: Diagnosis not present

## 2016-02-07 DIAGNOSIS — J449 Chronic obstructive pulmonary disease, unspecified: Secondary | ICD-10-CM | POA: Diagnosis not present

## 2016-02-07 DIAGNOSIS — J9601 Acute respiratory failure with hypoxia: Secondary | ICD-10-CM | POA: Diagnosis not present

## 2016-02-07 DIAGNOSIS — J180 Bronchopneumonia, unspecified organism: Secondary | ICD-10-CM | POA: Diagnosis not present

## 2016-02-07 DIAGNOSIS — J441 Chronic obstructive pulmonary disease with (acute) exacerbation: Secondary | ICD-10-CM | POA: Diagnosis not present

## 2016-02-10 DIAGNOSIS — J180 Bronchopneumonia, unspecified organism: Secondary | ICD-10-CM | POA: Diagnosis not present

## 2016-02-10 DIAGNOSIS — E860 Dehydration: Secondary | ICD-10-CM | POA: Diagnosis not present

## 2016-02-10 DIAGNOSIS — J9601 Acute respiratory failure with hypoxia: Secondary | ICD-10-CM | POA: Diagnosis not present

## 2016-02-10 DIAGNOSIS — J441 Chronic obstructive pulmonary disease with (acute) exacerbation: Secondary | ICD-10-CM | POA: Diagnosis not present

## 2016-02-25 DIAGNOSIS — M961 Postlaminectomy syndrome, not elsewhere classified: Secondary | ICD-10-CM | POA: Diagnosis not present

## 2016-02-25 DIAGNOSIS — J449 Chronic obstructive pulmonary disease, unspecified: Secondary | ICD-10-CM | POA: Diagnosis not present

## 2016-02-25 DIAGNOSIS — S32008A Other fracture of unspecified lumbar vertebra, initial encounter for closed fracture: Secondary | ICD-10-CM | POA: Diagnosis not present

## 2016-02-25 DIAGNOSIS — F1120 Opioid dependence, uncomplicated: Secondary | ICD-10-CM | POA: Diagnosis not present

## 2016-02-25 DIAGNOSIS — Z720 Tobacco use: Secondary | ICD-10-CM | POA: Diagnosis not present

## 2016-02-25 DIAGNOSIS — G894 Chronic pain syndrome: Secondary | ICD-10-CM | POA: Diagnosis not present

## 2016-02-25 DIAGNOSIS — Q761 Klippel-Feil syndrome: Secondary | ICD-10-CM | POA: Diagnosis not present

## 2016-03-02 DIAGNOSIS — R0902 Hypoxemia: Secondary | ICD-10-CM | POA: Diagnosis not present

## 2016-03-02 DIAGNOSIS — R0602 Shortness of breath: Secondary | ICD-10-CM | POA: Diagnosis not present

## 2016-03-02 DIAGNOSIS — J9610 Chronic respiratory failure, unspecified whether with hypoxia or hypercapnia: Secondary | ICD-10-CM | POA: Diagnosis not present

## 2016-03-02 DIAGNOSIS — Z9981 Dependence on supplemental oxygen: Secondary | ICD-10-CM | POA: Diagnosis not present

## 2016-03-02 DIAGNOSIS — R05 Cough: Secondary | ICD-10-CM | POA: Diagnosis not present

## 2016-03-03 DIAGNOSIS — R05 Cough: Secondary | ICD-10-CM | POA: Diagnosis not present

## 2016-03-03 DIAGNOSIS — Z9981 Dependence on supplemental oxygen: Secondary | ICD-10-CM | POA: Diagnosis not present

## 2016-03-03 DIAGNOSIS — Z6834 Body mass index (BMI) 34.0-34.9, adult: Secondary | ICD-10-CM | POA: Diagnosis not present

## 2016-03-03 DIAGNOSIS — J9610 Chronic respiratory failure, unspecified whether with hypoxia or hypercapnia: Secondary | ICD-10-CM | POA: Diagnosis not present

## 2016-03-03 DIAGNOSIS — R0602 Shortness of breath: Secondary | ICD-10-CM | POA: Diagnosis not present

## 2016-03-08 DIAGNOSIS — J180 Bronchopneumonia, unspecified organism: Secondary | ICD-10-CM | POA: Diagnosis not present

## 2016-03-08 DIAGNOSIS — J441 Chronic obstructive pulmonary disease with (acute) exacerbation: Secondary | ICD-10-CM | POA: Diagnosis not present

## 2016-03-08 DIAGNOSIS — J9601 Acute respiratory failure with hypoxia: Secondary | ICD-10-CM | POA: Diagnosis not present

## 2016-03-08 DIAGNOSIS — J449 Chronic obstructive pulmonary disease, unspecified: Secondary | ICD-10-CM | POA: Diagnosis not present

## 2016-03-10 DIAGNOSIS — R600 Localized edema: Secondary | ICD-10-CM | POA: Diagnosis not present

## 2016-03-10 DIAGNOSIS — Z6833 Body mass index (BMI) 33.0-33.9, adult: Secondary | ICD-10-CM | POA: Diagnosis not present

## 2016-03-10 DIAGNOSIS — R0602 Shortness of breath: Secondary | ICD-10-CM | POA: Diagnosis not present

## 2016-03-12 DIAGNOSIS — E860 Dehydration: Secondary | ICD-10-CM | POA: Diagnosis not present

## 2016-03-12 DIAGNOSIS — J9601 Acute respiratory failure with hypoxia: Secondary | ICD-10-CM | POA: Diagnosis not present

## 2016-03-12 DIAGNOSIS — J180 Bronchopneumonia, unspecified organism: Secondary | ICD-10-CM | POA: Diagnosis not present

## 2016-03-12 DIAGNOSIS — J441 Chronic obstructive pulmonary disease with (acute) exacerbation: Secondary | ICD-10-CM | POA: Diagnosis not present

## 2016-03-20 DIAGNOSIS — R600 Localized edema: Secondary | ICD-10-CM | POA: Diagnosis not present

## 2016-03-24 DIAGNOSIS — N055 Unspecified nephritic syndrome with diffuse mesangiocapillary glomerulonephritis: Secondary | ICD-10-CM | POA: Diagnosis not present

## 2016-03-24 DIAGNOSIS — Z6834 Body mass index (BMI) 34.0-34.9, adult: Secondary | ICD-10-CM | POA: Diagnosis not present

## 2016-03-24 DIAGNOSIS — R0602 Shortness of breath: Secondary | ICD-10-CM | POA: Diagnosis not present

## 2016-03-27 DIAGNOSIS — R221 Localized swelling, mass and lump, neck: Secondary | ICD-10-CM | POA: Diagnosis not present

## 2016-03-27 DIAGNOSIS — F1120 Opioid dependence, uncomplicated: Secondary | ICD-10-CM | POA: Diagnosis not present

## 2016-03-27 DIAGNOSIS — Q761 Klippel-Feil syndrome: Secondary | ICD-10-CM | POA: Diagnosis not present

## 2016-03-27 DIAGNOSIS — J449 Chronic obstructive pulmonary disease, unspecified: Secondary | ICD-10-CM | POA: Diagnosis not present

## 2016-03-27 DIAGNOSIS — G894 Chronic pain syndrome: Secondary | ICD-10-CM | POA: Diagnosis not present

## 2016-03-27 DIAGNOSIS — S32008A Other fracture of unspecified lumbar vertebra, initial encounter for closed fracture: Secondary | ICD-10-CM | POA: Diagnosis not present

## 2016-03-27 DIAGNOSIS — Z720 Tobacco use: Secondary | ICD-10-CM | POA: Diagnosis not present

## 2016-03-27 DIAGNOSIS — M961 Postlaminectomy syndrome, not elsewhere classified: Secondary | ICD-10-CM | POA: Diagnosis not present

## 2016-04-03 ENCOUNTER — Encounter: Payer: Self-pay | Admitting: Internal Medicine

## 2016-04-03 ENCOUNTER — Ambulatory Visit (INDEPENDENT_AMBULATORY_CARE_PROVIDER_SITE_OTHER): Payer: Commercial Managed Care - HMO | Admitting: Internal Medicine

## 2016-04-03 VITALS — BP 124/64 | HR 81 | Ht 62.0 in | Wt 186.6 lb

## 2016-04-03 DIAGNOSIS — J449 Chronic obstructive pulmonary disease, unspecified: Secondary | ICD-10-CM | POA: Diagnosis not present

## 2016-04-03 DIAGNOSIS — F17210 Nicotine dependence, cigarettes, uncomplicated: Secondary | ICD-10-CM

## 2016-04-03 DIAGNOSIS — J9611 Chronic respiratory failure with hypoxia: Secondary | ICD-10-CM | POA: Diagnosis not present

## 2016-04-03 MED ORDER — MOMETASONE FURO-FORMOTEROL FUM 200-5 MCG/ACT IN AERO: INHALATION_SPRAY | RESPIRATORY_TRACT | 11 refills | Status: AC

## 2016-04-03 MED ORDER — TIOTROPIUM BROMIDE MONOHYDRATE 2.5 MCG/ACT IN AERS: INHALATION_SPRAY | RESPIRATORY_TRACT | 0 refills | Status: AC

## 2016-04-03 MED ORDER — TIOTROPIUM BROMIDE MONOHYDRATE 2.5 MCG/ACT IN AERS: INHALATION_SPRAY | RESPIRATORY_TRACT | 11 refills | Status: DC

## 2016-04-03 MED ORDER — VARENICLINE TARTRATE 0.5 MG X 11 & 1 MG X 42 PO MISC: ORAL | 0 refills | Status: AC

## 2016-04-03 NOTE — Progress Notes (Signed)
Subjective:    Patient ID: [name], female    DOB: [birth year],    MRN: [id number]  HPI  22 yowf active smoker asthma as child, typically flared just with  colds, better p elementary school  with dx of copd around 1997 followed by Chodri and placed on 02 since summer 2016  But 24/7 since January 2017 referred to pulmonary clinic 04/03/2016 by Dr Nyra Capes' clinic by Dortha Kern PA   04/03/2016 1st [facility] office visit/ [name]  maint anoro  Chief Complaint  Patient presents with  . Pulmonary Consult    Referred by Scherrie Gerlach, PA. Pt c/o SOB x 8 months. She states that she gets winded walking short distances such as lobby to exam room.   on medrol x 3 weeks but likes prednisone better, more off than on over the last year or so  Was on advair/ spiriva combination previously, did not like symbicort but note baseline hfa poor - see a/p.  Baseline doe = MMRC3 = can't walk 100 yards even at a slow pace at a flat grade s stopping due to sob  Even on 02 .  No obvious day to day or daytime variability or assoc excess/ purulent sputum or mucus plugs or hemoptysis or cp or chest tightness, subjective wheeze or overt sinus or hb symptoms. No unusual exp hx or h/o childhood pna/ asthma or knowledge of premature birth.  Sleeping ok without nocturnal  or early am exacerbation  of respiratory  c/o's or need for noct saba. Also denies any obvious fluctuation of symptoms with weather or environmental changes or other aggravating or alleviating factors except as outlined above   Current Medications, Allergies, Complete Past Medical History, Past Surgical History, Family History, and Social History were reviewed in [employer] record.             Review of Systems  Constitutional: Negative for chills, fever and unexpected weight change.  HENT: Negative for congestion, dental problem, ear pain, nosebleeds, postnasal drip, rhinorrhea, sinus pressure, sneezing, sore  throat, trouble swallowing and voice change.   Eyes: Negative for visual disturbance.  Respiratory: Positive for cough and shortness of breath. Negative for choking.   Cardiovascular: Negative for chest pain and leg swelling.  Gastrointestinal: Negative for abdominal pain, diarrhea and vomiting.  Genitourinary: Negative for difficulty urinating.  Musculoskeletal: Positive for arthralgias.  Skin: Negative for rash.  Neurological: Negative for tremors, syncope and headaches.  Hematological: Does not bruise/bleed easily.       Objective:   Physical Exam  amb wf nad moderately cushingnoid appearing   Wt Readings from Last 3 Encounters:  04/03/16 186 lb 9.6 oz (84.6 kg)    Vital signs reviewed  - note sats 91% on 2lpm NP on arrival   HEENT: nl dentition, turbinates, and oropharynx. Nl external ear canals without cough reflex   NECK :  without JVD/Nodes/TM/ nl carotid upstrokes bilaterally   LUNGS: no acc muscle use,  slt barrel  contour chest which is clear to A and P bilaterally with distant bs bilaterally and late exp wheeze as well   CV:  RRR  no s3 or murmur or increase in P2, no edema   ABD:  soft and nontender with nl inspiratory excursion in the supine position. No bruits or organomegaly, bowel sounds nl  MS:  Nl gait/ ext warm without deformities, calf tenderness, cyanosis or clubbing No obvious joint restrictions   SKIN: warm and dry without lesions  NEURO:  alert, approp, nl sensorium with  no motor deficits            Assessment & Plan:

## 2016-04-03 NOTE — Patient Instructions (Addendum)
Plan A = Automatic =  dulera 200 Take 2 puffs first thing in am and then another 2 puffs about 12 hours later plus  spiriva 2 pffs each am and chantix    Plan B = Backup Only use your albuterol as a rescue medication to be used if you can't catch your breath by resting or doing a relaxed purse lip breathing pattern.  - The less you use it, the better it will work when you need it. - Ok to use the inhaler up to 2 puffs  every 4 hours if you must but call for appointment if use goes up over your usual need - Don't leave home without it !!  (think of it like the spare tire for your car)   Plan C = Crisis - only use your albuterol nebulizer if you first try Plan B and it fails to help > ok to use the nebulizer up to every 4 hours but if start needing it regularly call for immediate appointment  Change prilosec to Take 30-60 min before first meal of the day  GERD (REFLUX)  is an extremely common cause of respiratory symptoms just like yours , many times with no obvious heartburn at all.    It can be treated with medication, but also with lifestyle changes including elevation of the head of your bed (ideally with 6 inch  bed blocks),  Smoking cessation, avoidance of late meals, excessive alcohol, and avoid fatty foods, chocolate, peppermint, colas, red wine, and acidic juices such as orange juice.  NO MINT OR MENTHOL PRODUCTS SO NO COUGH DROPS  USE SUGARLESS CANDY INSTEAD (Jolley ranchers or Stover's or Life Savers) or even ice chips will also do - the key is to swallow to prevent all throat clearing. NO OIL BASED VITAMINS - use powdered substitutes.    The key is to stop smoking completely before smoking completely stops you!   Please schedule a follow up office visit in 4 weeks, sooner if needed

## 2016-04-04 DIAGNOSIS — F17210 Nicotine dependence, cigarettes, uncomplicated: Secondary | ICD-10-CM | POA: Insufficient documentation

## 2016-04-04 DIAGNOSIS — J449 Chronic obstructive pulmonary disease, unspecified: Secondary | ICD-10-CM | POA: Insufficient documentation

## 2016-04-04 DIAGNOSIS — J9611 Chronic respiratory failure with hypoxia: Secondary | ICD-10-CM | POA: Insufficient documentation

## 2016-04-04 NOTE — Assessment & Plan Note (Signed)
>   3 m  Discussed the risks and costs (both direct and indirect)  of smoking relative to the benefits of quitting  And pt wants to try chantix which apparently tolerated in the past so rec one month starter pack

## 2016-04-04 NOTE — Assessment & Plan Note (Signed)
Original rx summer 2016    As of 04/03/2016 rx = 2lpm 24/7  Also uses cpap > offered referral to sleep medicine prn or return to [facility] for this

## 2016-04-04 NOTE — Assessment & Plan Note (Addendum)
Clinically very severe and now 02 dep ? Also becoming steroid dep for flares c/w  Group D in terms of symptom/risk and laba/lama/ICS  therefore appropriate rx at this point.  04/03/2016  After extensive coaching HFA effectiveness =    75% try spiriva resp/dulera 200   Needs fresh set of pfts for complete copd staging   Obviously smoking is the greatest modifiable risk factor (see separate a/p)   Total time devoted to counseling  = 35/20m review case with pt/ discussion of options/alternatives/ personally creating written instructions  in presence of pt  then going over those specific  Instructions directly with the pt including how to use all of the meds but in particular covering each new medication in detail and the difference between the maintenance/automatic meds and the prns using an action plan format for the latter.  a

## 2016-04-06 DIAGNOSIS — J449 Chronic obstructive pulmonary disease, unspecified: Secondary | ICD-10-CM | POA: Diagnosis not present

## 2016-04-06 DIAGNOSIS — J9610 Chronic respiratory failure, unspecified whether with hypoxia or hypercapnia: Secondary | ICD-10-CM | POA: Diagnosis not present

## 2016-04-06 DIAGNOSIS — R590 Localized enlarged lymph nodes: Secondary | ICD-10-CM | POA: Diagnosis not present

## 2016-04-06 DIAGNOSIS — Z9981 Dependence on supplemental oxygen: Secondary | ICD-10-CM | POA: Diagnosis not present

## 2016-04-08 DIAGNOSIS — J449 Chronic obstructive pulmonary disease, unspecified: Secondary | ICD-10-CM | POA: Diagnosis not present

## 2016-04-08 DIAGNOSIS — J441 Chronic obstructive pulmonary disease with (acute) exacerbation: Secondary | ICD-10-CM | POA: Diagnosis not present

## 2016-04-08 DIAGNOSIS — J180 Bronchopneumonia, unspecified organism: Secondary | ICD-10-CM | POA: Diagnosis not present

## 2016-04-08 DIAGNOSIS — J9601 Acute respiratory failure with hypoxia: Secondary | ICD-10-CM | POA: Diagnosis not present

## 2016-04-09 ENCOUNTER — Telehealth: Payer: Self-pay | Admitting: Internal Medicine

## 2016-04-09 NOTE — Telephone Encounter (Signed)
Received PA for [facility] 200 from [facility] (P) [phone] Please advise Dr Melvyn Novas if you would like for [place] to initiate the PA or if you want to offer an alternative. Thanks.   Instructions 04/03/16  Plan A = Automatic =  dulera 200 Take 2 puffs first thing in am and then another 2 puffs about 12 hours later plus  spiriva 2 pffs each am and chantix   Plan B = Backup Only use your albuterol as a rescue medication to be used if you can't catch your breath by resting or doing a relaxed purse lip breathing pattern.  - The less you use it, the better it will work when you need it. - Ok to use the inhaler up to 2 puffs  every 4 hours if you must but call for appointment if use goes up over your usual need - Don't leave home without it !!  (think of it like the spare tire for your car)   Plan C = Crisis - only use your albuterol nebulizer if you first try Plan B and it fails to help > ok to use the nebulizer up to every 4 hours but if start needing it regularly call for immediate appointment  Change prilosec to Take 30-60 min before first meal of the day  GERD (REFLUX)  is an extremely common cause of respiratory symptoms just like yours , many times with no obvious heartburn at all.    It can be treated with medication, but also with lifestyle changes including elevation of the head of your bed (ideally with 6 inch  bed blocks),  Smoking cessation, avoidance of late meals, excessive alcohol, and avoid fatty foods, chocolate, peppermint, colas, red wine, and acidic juices such as orange juice.  NO MINT OR MENTHOL PRODUCTS SO NO COUGH DROPS  USE SUGARLESS CANDY INSTEAD (Jolley ranchers or Stover's or Life Savers) or even ice chips will also do - the key is to swallow to prevent all throat clearing. NO OIL BASED VITAMINS - use powdered substitutes.   The key is to stop smoking completely before smoking completely stops you!   Please schedule a follow up office visit in 4 weeks, sooner if  needed

## 2016-04-10 NOTE — Telephone Encounter (Signed)
[facility] [phone] PA information obtained [facility] [phone], opt 4, opt 1 Member ID # [id number] Pt has tried/failed: Symbicort, Spiriva, Advair and Anoro - stopped d/t being ineffective/adverse effects PA initiated for Dulera 250mcg Spoke with [place], [place] sent for Clinical review, determination within 24-72 hours Ref # [id number]  Will send to [facility] to keep an eye out for PA determination.

## 2016-04-10 NOTE — Telephone Encounter (Signed)
Yes, she's already tried symbiocort

## 2016-04-12 DIAGNOSIS — J441 Chronic obstructive pulmonary disease with (acute) exacerbation: Secondary | ICD-10-CM | POA: Diagnosis not present

## 2016-04-12 DIAGNOSIS — E860 Dehydration: Secondary | ICD-10-CM | POA: Diagnosis not present

## 2016-04-12 DIAGNOSIS — J180 Bronchopneumonia, unspecified organism: Secondary | ICD-10-CM | POA: Diagnosis not present

## 2016-04-12 DIAGNOSIS — J9601 Acute respiratory failure with hypoxia: Secondary | ICD-10-CM | POA: Diagnosis not present

## 2016-04-14 NOTE — Telephone Encounter (Signed)
Called spoke with pt. She states that she has tried samples of dulera and says that it does not help. I offered samples of symbicort 160 and she states she tried them as well and did not see any improvement. I offered her an ov with MW on 02/14/16. She refused stating that she would call her insurance for her formulary and call our office back with the alternatives. She voiced understanding and had no further questions.

## 2016-04-14 NOTE — Telephone Encounter (Signed)
See if we have samples of dulera 200 until next ov otherwise she'll need to use symbicort 160 samples on hand just until her next ov when we can regroup with hopefully  her formulary in hand

## 2016-04-14 NOTE — Telephone Encounter (Signed)
Received letter from [facility] stating the [name] was denied  They did not list any alternatives, unfortunately  The letter states formulary was mailed to pt  Please advise thanks!

## 2016-04-23 DIAGNOSIS — R0689 Other abnormalities of breathing: Secondary | ICD-10-CM | POA: Diagnosis not present

## 2016-04-23 DIAGNOSIS — Z9981 Dependence on supplemental oxygen: Secondary | ICD-10-CM | POA: Diagnosis not present

## 2016-04-23 DIAGNOSIS — R0989 Other specified symptoms and signs involving the circulatory and respiratory systems: Secondary | ICD-10-CM | POA: Diagnosis not present

## 2016-04-23 DIAGNOSIS — H903 Sensorineural hearing loss, bilateral: Secondary | ICD-10-CM | POA: Diagnosis not present

## 2016-04-23 DIAGNOSIS — R590 Localized enlarged lymph nodes: Secondary | ICD-10-CM | POA: Diagnosis not present

## 2016-04-28 DIAGNOSIS — G894 Chronic pain syndrome: Secondary | ICD-10-CM | POA: Diagnosis not present

## 2016-04-28 DIAGNOSIS — J449 Chronic obstructive pulmonary disease, unspecified: Secondary | ICD-10-CM | POA: Diagnosis not present

## 2016-04-28 DIAGNOSIS — M961 Postlaminectomy syndrome, not elsewhere classified: Secondary | ICD-10-CM | POA: Diagnosis not present

## 2016-04-28 DIAGNOSIS — Z720 Tobacco use: Secondary | ICD-10-CM | POA: Diagnosis not present

## 2016-04-28 DIAGNOSIS — F1120 Opioid dependence, uncomplicated: Secondary | ICD-10-CM | POA: Diagnosis not present

## 2016-04-28 DIAGNOSIS — S32008A Other fracture of unspecified lumbar vertebra, initial encounter for closed fracture: Secondary | ICD-10-CM | POA: Diagnosis not present

## 2016-04-28 DIAGNOSIS — Q761 Klippel-Feil syndrome: Secondary | ICD-10-CM | POA: Diagnosis not present

## 2016-04-28 DIAGNOSIS — Z79891 Long term (current) use of opiate analgesic: Secondary | ICD-10-CM | POA: Diagnosis not present

## 2016-05-04 ENCOUNTER — Ambulatory Visit: Payer: Commercial Managed Care - HMO | Admitting: Internal Medicine

## 2016-05-07 DIAGNOSIS — Z23 Encounter for immunization: Secondary | ICD-10-CM | POA: Diagnosis not present

## 2016-05-07 DIAGNOSIS — R590 Localized enlarged lymph nodes: Secondary | ICD-10-CM | POA: Diagnosis not present

## 2016-05-07 DIAGNOSIS — Z0000 Encounter for general adult medical examination without abnormal findings: Secondary | ICD-10-CM | POA: Diagnosis not present

## 2016-05-07 DIAGNOSIS — Z139 Encounter for screening, unspecified: Secondary | ICD-10-CM | POA: Diagnosis not present

## 2016-05-07 DIAGNOSIS — J449 Chronic obstructive pulmonary disease, unspecified: Secondary | ICD-10-CM | POA: Diagnosis not present

## 2016-05-07 DIAGNOSIS — I6529 Occlusion and stenosis of unspecified carotid artery: Secondary | ICD-10-CM | POA: Diagnosis not present

## 2016-05-07 DIAGNOSIS — N289 Disorder of kidney and ureter, unspecified: Secondary | ICD-10-CM | POA: Diagnosis not present

## 2016-05-09 DIAGNOSIS — J441 Chronic obstructive pulmonary disease with (acute) exacerbation: Secondary | ICD-10-CM | POA: Diagnosis not present

## 2016-05-09 DIAGNOSIS — J449 Chronic obstructive pulmonary disease, unspecified: Secondary | ICD-10-CM | POA: Diagnosis not present

## 2016-05-09 DIAGNOSIS — J180 Bronchopneumonia, unspecified organism: Secondary | ICD-10-CM | POA: Diagnosis not present

## 2016-05-09 DIAGNOSIS — J9601 Acute respiratory failure with hypoxia: Secondary | ICD-10-CM | POA: Diagnosis not present

## 2016-05-09 DIAGNOSIS — G4733 Obstructive sleep apnea (adult) (pediatric): Secondary | ICD-10-CM | POA: Diagnosis not present

## 2016-05-12 DIAGNOSIS — E860 Dehydration: Secondary | ICD-10-CM | POA: Diagnosis not present

## 2016-05-12 DIAGNOSIS — J180 Bronchopneumonia, unspecified organism: Secondary | ICD-10-CM | POA: Diagnosis not present

## 2016-05-12 DIAGNOSIS — J441 Chronic obstructive pulmonary disease with (acute) exacerbation: Secondary | ICD-10-CM | POA: Diagnosis not present

## 2016-05-12 DIAGNOSIS — J9601 Acute respiratory failure with hypoxia: Secondary | ICD-10-CM | POA: Diagnosis not present

## 2016-05-14 DIAGNOSIS — D490 Neoplasm of unspecified behavior of digestive system: Secondary | ICD-10-CM | POA: Diagnosis not present

## 2016-05-14 DIAGNOSIS — R221 Localized swelling, mass and lump, neck: Secondary | ICD-10-CM | POA: Diagnosis not present

## 2016-05-26 DIAGNOSIS — Q761 Klippel-Feil syndrome: Secondary | ICD-10-CM | POA: Diagnosis not present

## 2016-05-26 DIAGNOSIS — Z720 Tobacco use: Secondary | ICD-10-CM | POA: Diagnosis not present

## 2016-05-26 DIAGNOSIS — G894 Chronic pain syndrome: Secondary | ICD-10-CM | POA: Diagnosis not present

## 2016-05-26 DIAGNOSIS — M961 Postlaminectomy syndrome, not elsewhere classified: Secondary | ICD-10-CM | POA: Diagnosis not present

## 2016-05-26 DIAGNOSIS — S32008A Other fracture of unspecified lumbar vertebra, initial encounter for closed fracture: Secondary | ICD-10-CM | POA: Diagnosis not present

## 2016-05-26 DIAGNOSIS — Z79891 Long term (current) use of opiate analgesic: Secondary | ICD-10-CM | POA: Diagnosis not present

## 2016-05-26 DIAGNOSIS — F1120 Opioid dependence, uncomplicated: Secondary | ICD-10-CM | POA: Diagnosis not present

## 2016-05-26 DIAGNOSIS — J449 Chronic obstructive pulmonary disease, unspecified: Secondary | ICD-10-CM | POA: Diagnosis not present

## 2016-06-02 DIAGNOSIS — R221 Localized swelling, mass and lump, neck: Secondary | ICD-10-CM | POA: Diagnosis not present

## 2016-06-02 DIAGNOSIS — R590 Localized enlarged lymph nodes: Secondary | ICD-10-CM | POA: Diagnosis not present

## 2016-06-02 DIAGNOSIS — K118 Other diseases of salivary glands: Secondary | ICD-10-CM | POA: Diagnosis not present

## 2016-06-08 DIAGNOSIS — F17200 Nicotine dependence, unspecified, uncomplicated: Secondary | ICD-10-CM | POA: Diagnosis not present

## 2016-06-08 DIAGNOSIS — J441 Chronic obstructive pulmonary disease with (acute) exacerbation: Secondary | ICD-10-CM | POA: Diagnosis not present

## 2016-06-08 DIAGNOSIS — J449 Chronic obstructive pulmonary disease, unspecified: Secondary | ICD-10-CM | POA: Diagnosis not present

## 2016-06-08 DIAGNOSIS — J180 Bronchopneumonia, unspecified organism: Secondary | ICD-10-CM | POA: Diagnosis not present

## 2016-06-08 DIAGNOSIS — J9601 Acute respiratory failure with hypoxia: Secondary | ICD-10-CM | POA: Diagnosis not present

## 2016-06-08 DIAGNOSIS — D119 Benign neoplasm of major salivary gland, unspecified: Secondary | ICD-10-CM | POA: Diagnosis not present

## 2016-06-08 DIAGNOSIS — D110 Benign neoplasm of parotid gland: Secondary | ICD-10-CM | POA: Diagnosis not present

## 2016-06-12 DIAGNOSIS — E860 Dehydration: Secondary | ICD-10-CM | POA: Diagnosis not present

## 2016-06-12 DIAGNOSIS — J441 Chronic obstructive pulmonary disease with (acute) exacerbation: Secondary | ICD-10-CM | POA: Diagnosis not present

## 2016-06-12 DIAGNOSIS — J9601 Acute respiratory failure with hypoxia: Secondary | ICD-10-CM | POA: Diagnosis not present

## 2016-06-12 DIAGNOSIS — J180 Bronchopneumonia, unspecified organism: Secondary | ICD-10-CM | POA: Diagnosis not present

## 2016-06-23 DIAGNOSIS — Z720 Tobacco use: Secondary | ICD-10-CM | POA: Diagnosis not present

## 2016-06-23 DIAGNOSIS — S32008A Other fracture of unspecified lumbar vertebra, initial encounter for closed fracture: Secondary | ICD-10-CM | POA: Diagnosis not present

## 2016-06-23 DIAGNOSIS — J449 Chronic obstructive pulmonary disease, unspecified: Secondary | ICD-10-CM | POA: Diagnosis not present

## 2016-06-23 DIAGNOSIS — F1120 Opioid dependence, uncomplicated: Secondary | ICD-10-CM | POA: Diagnosis not present

## 2016-06-23 DIAGNOSIS — M961 Postlaminectomy syndrome, not elsewhere classified: Secondary | ICD-10-CM | POA: Diagnosis not present

## 2016-06-23 DIAGNOSIS — Z79891 Long term (current) use of opiate analgesic: Secondary | ICD-10-CM | POA: Diagnosis not present

## 2016-06-23 DIAGNOSIS — G894 Chronic pain syndrome: Secondary | ICD-10-CM | POA: Diagnosis not present

## 2016-06-23 DIAGNOSIS — Q761 Klippel-Feil syndrome: Secondary | ICD-10-CM | POA: Diagnosis not present

## 2016-07-09 DIAGNOSIS — J180 Bronchopneumonia, unspecified organism: Secondary | ICD-10-CM | POA: Diagnosis not present

## 2016-07-09 DIAGNOSIS — J449 Chronic obstructive pulmonary disease, unspecified: Secondary | ICD-10-CM | POA: Diagnosis not present

## 2016-07-09 DIAGNOSIS — J441 Chronic obstructive pulmonary disease with (acute) exacerbation: Secondary | ICD-10-CM | POA: Diagnosis not present

## 2016-07-09 DIAGNOSIS — J9601 Acute respiratory failure with hypoxia: Secondary | ICD-10-CM | POA: Diagnosis not present

## 2016-07-12 DIAGNOSIS — J441 Chronic obstructive pulmonary disease with (acute) exacerbation: Secondary | ICD-10-CM | POA: Diagnosis not present

## 2016-07-12 DIAGNOSIS — J9601 Acute respiratory failure with hypoxia: Secondary | ICD-10-CM | POA: Diagnosis not present

## 2016-07-12 DIAGNOSIS — J180 Bronchopneumonia, unspecified organism: Secondary | ICD-10-CM | POA: Diagnosis not present

## 2016-07-12 DIAGNOSIS — E860 Dehydration: Secondary | ICD-10-CM | POA: Diagnosis not present

## 2016-07-21 DIAGNOSIS — Z13220 Encounter for screening for lipoid disorders: Secondary | ICD-10-CM | POA: Diagnosis not present

## 2016-07-21 DIAGNOSIS — N183 Chronic kidney disease, stage 3 (moderate): Secondary | ICD-10-CM | POA: Diagnosis not present

## 2016-08-04 DIAGNOSIS — J449 Chronic obstructive pulmonary disease, unspecified: Secondary | ICD-10-CM | POA: Diagnosis not present

## 2016-08-04 DIAGNOSIS — Z9981 Dependence on supplemental oxygen: Secondary | ICD-10-CM | POA: Diagnosis not present

## 2016-08-04 DIAGNOSIS — Z8739 Personal history of other diseases of the musculoskeletal system and connective tissue: Secondary | ICD-10-CM | POA: Diagnosis not present

## 2016-08-04 DIAGNOSIS — M7062 Trochanteric bursitis, left hip: Secondary | ICD-10-CM | POA: Diagnosis not present

## 2016-08-08 DIAGNOSIS — J449 Chronic obstructive pulmonary disease, unspecified: Secondary | ICD-10-CM | POA: Diagnosis not present

## 2016-08-08 DIAGNOSIS — J9601 Acute respiratory failure with hypoxia: Secondary | ICD-10-CM | POA: Diagnosis not present

## 2016-08-08 DIAGNOSIS — J180 Bronchopneumonia, unspecified organism: Secondary | ICD-10-CM | POA: Diagnosis not present

## 2016-08-08 DIAGNOSIS — J441 Chronic obstructive pulmonary disease with (acute) exacerbation: Secondary | ICD-10-CM | POA: Diagnosis not present

## 2016-08-12 DIAGNOSIS — J441 Chronic obstructive pulmonary disease with (acute) exacerbation: Secondary | ICD-10-CM | POA: Diagnosis not present

## 2016-08-12 DIAGNOSIS — J180 Bronchopneumonia, unspecified organism: Secondary | ICD-10-CM | POA: Diagnosis not present

## 2016-08-12 DIAGNOSIS — J9601 Acute respiratory failure with hypoxia: Secondary | ICD-10-CM | POA: Diagnosis not present

## 2016-08-12 DIAGNOSIS — E860 Dehydration: Secondary | ICD-10-CM | POA: Diagnosis not present

## 2016-08-17 DIAGNOSIS — G894 Chronic pain syndrome: Secondary | ICD-10-CM | POA: Diagnosis not present

## 2016-08-17 DIAGNOSIS — S32008A Other fracture of unspecified lumbar vertebra, initial encounter for closed fracture: Secondary | ICD-10-CM | POA: Diagnosis not present

## 2016-08-17 DIAGNOSIS — Q761 Klippel-Feil syndrome: Secondary | ICD-10-CM | POA: Diagnosis not present

## 2016-08-17 DIAGNOSIS — F1120 Opioid dependence, uncomplicated: Secondary | ICD-10-CM | POA: Diagnosis not present

## 2016-08-17 DIAGNOSIS — M961 Postlaminectomy syndrome, not elsewhere classified: Secondary | ICD-10-CM | POA: Diagnosis not present

## 2016-08-17 DIAGNOSIS — J449 Chronic obstructive pulmonary disease, unspecified: Secondary | ICD-10-CM | POA: Diagnosis not present

## 2016-08-17 DIAGNOSIS — Z79891 Long term (current) use of opiate analgesic: Secondary | ICD-10-CM | POA: Diagnosis not present

## 2016-08-18 DIAGNOSIS — Z6836 Body mass index (BMI) 36.0-36.9, adult: Secondary | ICD-10-CM | POA: Diagnosis not present

## 2016-08-18 DIAGNOSIS — Z1231 Encounter for screening mammogram for malignant neoplasm of breast: Secondary | ICD-10-CM | POA: Diagnosis not present

## 2016-08-18 DIAGNOSIS — M810 Age-related osteoporosis without current pathological fracture: Secondary | ICD-10-CM | POA: Diagnosis not present

## 2016-08-31 DIAGNOSIS — M8589 Other specified disorders of bone density and structure, multiple sites: Secondary | ICD-10-CM | POA: Diagnosis not present

## 2016-08-31 DIAGNOSIS — Z1231 Encounter for screening mammogram for malignant neoplasm of breast: Secondary | ICD-10-CM | POA: Diagnosis not present

## 2016-08-31 DIAGNOSIS — Z780 Asymptomatic menopausal state: Secondary | ICD-10-CM | POA: Diagnosis not present

## 2016-08-31 DIAGNOSIS — M810 Age-related osteoporosis without current pathological fracture: Secondary | ICD-10-CM | POA: Diagnosis not present

## 2016-09-01 DIAGNOSIS — N183 Chronic kidney disease, stage 3 (moderate): Secondary | ICD-10-CM | POA: Diagnosis not present

## 2016-09-01 DIAGNOSIS — I10 Essential (primary) hypertension: Secondary | ICD-10-CM | POA: Diagnosis not present

## 2016-09-01 DIAGNOSIS — E559 Vitamin D deficiency, unspecified: Secondary | ICD-10-CM | POA: Diagnosis not present

## 2016-09-01 DIAGNOSIS — R809 Proteinuria, unspecified: Secondary | ICD-10-CM | POA: Diagnosis not present

## 2016-09-01 DIAGNOSIS — N390 Urinary tract infection, site not specified: Secondary | ICD-10-CM | POA: Diagnosis not present

## 2016-09-04 DIAGNOSIS — M810 Age-related osteoporosis without current pathological fracture: Secondary | ICD-10-CM | POA: Diagnosis not present

## 2016-09-04 DIAGNOSIS — Z6835 Body mass index (BMI) 35.0-35.9, adult: Secondary | ICD-10-CM | POA: Diagnosis not present

## 2016-09-08 DIAGNOSIS — J441 Chronic obstructive pulmonary disease with (acute) exacerbation: Secondary | ICD-10-CM | POA: Diagnosis not present

## 2016-09-08 DIAGNOSIS — J449 Chronic obstructive pulmonary disease, unspecified: Secondary | ICD-10-CM | POA: Diagnosis not present

## 2016-09-08 DIAGNOSIS — J180 Bronchopneumonia, unspecified organism: Secondary | ICD-10-CM | POA: Diagnosis not present

## 2016-09-08 DIAGNOSIS — J9601 Acute respiratory failure with hypoxia: Secondary | ICD-10-CM | POA: Diagnosis not present

## 2016-09-12 DIAGNOSIS — J9601 Acute respiratory failure with hypoxia: Secondary | ICD-10-CM | POA: Diagnosis not present

## 2016-09-12 DIAGNOSIS — E860 Dehydration: Secondary | ICD-10-CM | POA: Diagnosis not present

## 2016-09-12 DIAGNOSIS — J180 Bronchopneumonia, unspecified organism: Secondary | ICD-10-CM | POA: Diagnosis not present

## 2016-09-12 DIAGNOSIS — J441 Chronic obstructive pulmonary disease with (acute) exacerbation: Secondary | ICD-10-CM | POA: Diagnosis not present

## 2016-09-14 DIAGNOSIS — F1120 Opioid dependence, uncomplicated: Secondary | ICD-10-CM | POA: Diagnosis not present

## 2016-09-14 DIAGNOSIS — G894 Chronic pain syndrome: Secondary | ICD-10-CM | POA: Diagnosis not present

## 2016-09-14 DIAGNOSIS — J449 Chronic obstructive pulmonary disease, unspecified: Secondary | ICD-10-CM | POA: Diagnosis not present

## 2016-09-14 DIAGNOSIS — Z79891 Long term (current) use of opiate analgesic: Secondary | ICD-10-CM | POA: Diagnosis not present

## 2016-09-14 DIAGNOSIS — S32008A Other fracture of unspecified lumbar vertebra, initial encounter for closed fracture: Secondary | ICD-10-CM | POA: Diagnosis not present

## 2016-09-14 DIAGNOSIS — Q761 Klippel-Feil syndrome: Secondary | ICD-10-CM | POA: Diagnosis not present

## 2016-09-14 DIAGNOSIS — M961 Postlaminectomy syndrome, not elsewhere classified: Secondary | ICD-10-CM | POA: Diagnosis not present

## 2016-10-07 DIAGNOSIS — J180 Bronchopneumonia, unspecified organism: Secondary | ICD-10-CM | POA: Diagnosis not present

## 2016-10-07 DIAGNOSIS — J449 Chronic obstructive pulmonary disease, unspecified: Secondary | ICD-10-CM | POA: Diagnosis not present

## 2016-10-07 DIAGNOSIS — J441 Chronic obstructive pulmonary disease with (acute) exacerbation: Secondary | ICD-10-CM | POA: Diagnosis not present

## 2016-10-07 DIAGNOSIS — J9601 Acute respiratory failure with hypoxia: Secondary | ICD-10-CM | POA: Diagnosis not present

## 2016-10-10 DIAGNOSIS — J441 Chronic obstructive pulmonary disease with (acute) exacerbation: Secondary | ICD-10-CM | POA: Diagnosis not present

## 2016-10-10 DIAGNOSIS — E860 Dehydration: Secondary | ICD-10-CM | POA: Diagnosis not present

## 2016-10-10 DIAGNOSIS — J9601 Acute respiratory failure with hypoxia: Secondary | ICD-10-CM | POA: Diagnosis not present

## 2016-10-10 DIAGNOSIS — J180 Bronchopneumonia, unspecified organism: Secondary | ICD-10-CM | POA: Diagnosis not present

## 2016-10-13 DIAGNOSIS — M961 Postlaminectomy syndrome, not elsewhere classified: Secondary | ICD-10-CM | POA: Diagnosis not present

## 2016-10-13 DIAGNOSIS — S32008A Other fracture of unspecified lumbar vertebra, initial encounter for closed fracture: Secondary | ICD-10-CM | POA: Diagnosis not present

## 2016-10-13 DIAGNOSIS — Z79891 Long term (current) use of opiate analgesic: Secondary | ICD-10-CM | POA: Diagnosis not present

## 2016-10-13 DIAGNOSIS — J449 Chronic obstructive pulmonary disease, unspecified: Secondary | ICD-10-CM | POA: Diagnosis not present

## 2016-10-13 DIAGNOSIS — G894 Chronic pain syndrome: Secondary | ICD-10-CM | POA: Diagnosis not present

## 2016-10-13 DIAGNOSIS — Q761 Klippel-Feil syndrome: Secondary | ICD-10-CM | POA: Diagnosis not present

## 2016-10-13 DIAGNOSIS — F1120 Opioid dependence, uncomplicated: Secondary | ICD-10-CM | POA: Diagnosis not present

## 2016-11-06 DIAGNOSIS — J9601 Acute respiratory failure with hypoxia: Secondary | ICD-10-CM | POA: Diagnosis not present

## 2016-11-06 DIAGNOSIS — J449 Chronic obstructive pulmonary disease, unspecified: Secondary | ICD-10-CM | POA: Diagnosis not present

## 2016-11-06 DIAGNOSIS — J441 Chronic obstructive pulmonary disease with (acute) exacerbation: Secondary | ICD-10-CM | POA: Diagnosis not present

## 2016-11-06 DIAGNOSIS — J180 Bronchopneumonia, unspecified organism: Secondary | ICD-10-CM | POA: Diagnosis not present

## 2016-11-10 DIAGNOSIS — J180 Bronchopneumonia, unspecified organism: Secondary | ICD-10-CM | POA: Diagnosis not present

## 2016-11-10 DIAGNOSIS — J9601 Acute respiratory failure with hypoxia: Secondary | ICD-10-CM | POA: Diagnosis not present

## 2016-11-10 DIAGNOSIS — J441 Chronic obstructive pulmonary disease with (acute) exacerbation: Secondary | ICD-10-CM | POA: Diagnosis not present

## 2016-11-10 DIAGNOSIS — E860 Dehydration: Secondary | ICD-10-CM | POA: Diagnosis not present

## 2016-11-12 DIAGNOSIS — I214 Non-ST elevation (NSTEMI) myocardial infarction: Secondary | ICD-10-CM | POA: Diagnosis not present

## 2016-11-12 DIAGNOSIS — I517 Cardiomegaly: Secondary | ICD-10-CM | POA: Diagnosis not present

## 2016-11-12 DIAGNOSIS — R079 Chest pain, unspecified: Secondary | ICD-10-CM | POA: Diagnosis not present

## 2016-11-12 DIAGNOSIS — R0600 Dyspnea, unspecified: Secondary | ICD-10-CM | POA: Diagnosis not present

## 2016-11-12 DIAGNOSIS — Z720 Tobacco use: Secondary | ICD-10-CM | POA: Diagnosis not present

## 2016-11-12 DIAGNOSIS — J9601 Acute respiratory failure with hypoxia: Secondary | ICD-10-CM | POA: Diagnosis not present

## 2016-11-12 DIAGNOSIS — N182 Chronic kidney disease, stage 2 (mild): Secondary | ICD-10-CM | POA: Diagnosis not present

## 2016-11-12 DIAGNOSIS — R945 Abnormal results of liver function studies: Secondary | ICD-10-CM | POA: Diagnosis not present

## 2016-11-12 DIAGNOSIS — E119 Type 2 diabetes mellitus without complications: Secondary | ICD-10-CM | POA: Diagnosis not present

## 2016-11-12 DIAGNOSIS — K219 Gastro-esophageal reflux disease without esophagitis: Secondary | ICD-10-CM | POA: Diagnosis not present

## 2016-11-12 DIAGNOSIS — Z8249 Family history of ischemic heart disease and other diseases of the circulatory system: Secondary | ICD-10-CM | POA: Diagnosis not present

## 2016-11-12 DIAGNOSIS — I129 Hypertensive chronic kidney disease with stage 1 through stage 4 chronic kidney disease, or unspecified chronic kidney disease: Secondary | ICD-10-CM | POA: Diagnosis not present

## 2016-11-12 DIAGNOSIS — R748 Abnormal levels of other serum enzymes: Secondary | ICD-10-CM | POA: Diagnosis not present

## 2016-11-12 DIAGNOSIS — K761 Chronic passive congestion of liver: Secondary | ICD-10-CM | POA: Diagnosis not present

## 2016-11-12 DIAGNOSIS — N189 Chronic kidney disease, unspecified: Secondary | ICD-10-CM | POA: Diagnosis not present

## 2016-11-12 DIAGNOSIS — R0602 Shortness of breath: Secondary | ICD-10-CM | POA: Diagnosis not present

## 2016-11-12 DIAGNOSIS — E860 Dehydration: Secondary | ICD-10-CM | POA: Diagnosis not present

## 2016-11-12 DIAGNOSIS — J9621 Acute and chronic respiratory failure with hypoxia: Secondary | ICD-10-CM | POA: Diagnosis not present

## 2016-11-12 DIAGNOSIS — F17211 Nicotine dependence, cigarettes, in remission: Secondary | ICD-10-CM | POA: Diagnosis not present

## 2016-11-12 DIAGNOSIS — Z87891 Personal history of nicotine dependence: Secondary | ICD-10-CM | POA: Diagnosis not present

## 2016-11-12 DIAGNOSIS — J441 Chronic obstructive pulmonary disease with (acute) exacerbation: Secondary | ICD-10-CM | POA: Diagnosis not present

## 2016-11-17 DIAGNOSIS — E860 Dehydration: Secondary | ICD-10-CM | POA: Diagnosis not present

## 2016-11-17 DIAGNOSIS — E119 Type 2 diabetes mellitus without complications: Secondary | ICD-10-CM | POA: Diagnosis not present

## 2016-11-17 DIAGNOSIS — I214 Non-ST elevation (NSTEMI) myocardial infarction: Secondary | ICD-10-CM | POA: Diagnosis not present

## 2016-11-17 DIAGNOSIS — J441 Chronic obstructive pulmonary disease with (acute) exacerbation: Secondary | ICD-10-CM | POA: Diagnosis not present

## 2016-11-17 DIAGNOSIS — I129 Hypertensive chronic kidney disease with stage 1 through stage 4 chronic kidney disease, or unspecified chronic kidney disease: Secondary | ICD-10-CM | POA: Diagnosis not present

## 2016-11-17 DIAGNOSIS — N182 Chronic kidney disease, stage 2 (mild): Secondary | ICD-10-CM | POA: Diagnosis not present

## 2016-11-17 DIAGNOSIS — R748 Abnormal levels of other serum enzymes: Secondary | ICD-10-CM | POA: Diagnosis not present

## 2016-11-17 DIAGNOSIS — Z720 Tobacco use: Secondary | ICD-10-CM | POA: Diagnosis not present

## 2016-11-17 DIAGNOSIS — K761 Chronic passive congestion of liver: Secondary | ICD-10-CM | POA: Diagnosis not present

## 2016-11-17 DIAGNOSIS — Z87891 Personal history of nicotine dependence: Secondary | ICD-10-CM | POA: Diagnosis not present

## 2016-11-17 DIAGNOSIS — K219 Gastro-esophageal reflux disease without esophagitis: Secondary | ICD-10-CM | POA: Diagnosis not present

## 2016-11-17 DIAGNOSIS — N189 Chronic kidney disease, unspecified: Secondary | ICD-10-CM | POA: Diagnosis not present

## 2016-11-17 DIAGNOSIS — J9621 Acute and chronic respiratory failure with hypoxia: Secondary | ICD-10-CM | POA: Diagnosis not present

## 2016-11-19 DIAGNOSIS — F1120 Opioid dependence, uncomplicated: Secondary | ICD-10-CM | POA: Diagnosis not present

## 2016-11-19 DIAGNOSIS — J449 Chronic obstructive pulmonary disease, unspecified: Secondary | ICD-10-CM | POA: Diagnosis not present

## 2016-11-19 DIAGNOSIS — S32008A Other fracture of unspecified lumbar vertebra, initial encounter for closed fracture: Secondary | ICD-10-CM | POA: Diagnosis not present

## 2016-11-19 DIAGNOSIS — Z79891 Long term (current) use of opiate analgesic: Secondary | ICD-10-CM | POA: Diagnosis not present

## 2016-11-19 DIAGNOSIS — M961 Postlaminectomy syndrome, not elsewhere classified: Secondary | ICD-10-CM | POA: Diagnosis not present

## 2016-11-19 DIAGNOSIS — G894 Chronic pain syndrome: Secondary | ICD-10-CM | POA: Diagnosis not present

## 2016-11-19 DIAGNOSIS — Q761 Klippel-Feil syndrome: Secondary | ICD-10-CM | POA: Diagnosis not present

## 2016-11-21 DIAGNOSIS — S81802D Unspecified open wound, left lower leg, subsequent encounter: Secondary | ICD-10-CM | POA: Diagnosis not present

## 2016-11-21 DIAGNOSIS — I214 Non-ST elevation (NSTEMI) myocardial infarction: Secondary | ICD-10-CM | POA: Diagnosis not present

## 2016-11-21 DIAGNOSIS — K219 Gastro-esophageal reflux disease without esophagitis: Secondary | ICD-10-CM | POA: Diagnosis not present

## 2016-11-21 DIAGNOSIS — J45909 Unspecified asthma, uncomplicated: Secondary | ICD-10-CM | POA: Diagnosis not present

## 2016-11-21 DIAGNOSIS — I129 Hypertensive chronic kidney disease with stage 1 through stage 4 chronic kidney disease, or unspecified chronic kidney disease: Secondary | ICD-10-CM | POA: Diagnosis not present

## 2016-11-21 DIAGNOSIS — G35 Multiple sclerosis: Secondary | ICD-10-CM | POA: Diagnosis not present

## 2016-11-21 DIAGNOSIS — N182 Chronic kidney disease, stage 2 (mild): Secondary | ICD-10-CM | POA: Diagnosis not present

## 2016-11-21 DIAGNOSIS — J441 Chronic obstructive pulmonary disease with (acute) exacerbation: Secondary | ICD-10-CM | POA: Diagnosis not present

## 2016-11-21 DIAGNOSIS — E1122 Type 2 diabetes mellitus with diabetic chronic kidney disease: Secondary | ICD-10-CM | POA: Diagnosis not present

## 2016-11-24 DIAGNOSIS — I129 Hypertensive chronic kidney disease with stage 1 through stage 4 chronic kidney disease, or unspecified chronic kidney disease: Secondary | ICD-10-CM | POA: Diagnosis not present

## 2016-11-24 DIAGNOSIS — G35 Multiple sclerosis: Secondary | ICD-10-CM | POA: Diagnosis not present

## 2016-11-24 DIAGNOSIS — S81802D Unspecified open wound, left lower leg, subsequent encounter: Secondary | ICD-10-CM | POA: Diagnosis not present

## 2016-11-24 DIAGNOSIS — Z9981 Dependence on supplemental oxygen: Secondary | ICD-10-CM | POA: Diagnosis not present

## 2016-11-24 DIAGNOSIS — S81802A Unspecified open wound, left lower leg, initial encounter: Secondary | ICD-10-CM | POA: Diagnosis not present

## 2016-11-24 DIAGNOSIS — I214 Non-ST elevation (NSTEMI) myocardial infarction: Secondary | ICD-10-CM | POA: Diagnosis not present

## 2016-11-24 DIAGNOSIS — K219 Gastro-esophageal reflux disease without esophagitis: Secondary | ICD-10-CM | POA: Diagnosis not present

## 2016-11-24 DIAGNOSIS — J45909 Unspecified asthma, uncomplicated: Secondary | ICD-10-CM | POA: Diagnosis not present

## 2016-11-24 DIAGNOSIS — N182 Chronic kidney disease, stage 2 (mild): Secondary | ICD-10-CM | POA: Diagnosis not present

## 2016-11-24 DIAGNOSIS — J449 Chronic obstructive pulmonary disease, unspecified: Secondary | ICD-10-CM | POA: Diagnosis not present

## 2016-11-24 DIAGNOSIS — J441 Chronic obstructive pulmonary disease with (acute) exacerbation: Secondary | ICD-10-CM | POA: Diagnosis not present

## 2016-11-24 DIAGNOSIS — J9610 Chronic respiratory failure, unspecified whether with hypoxia or hypercapnia: Secondary | ICD-10-CM | POA: Diagnosis not present

## 2016-11-24 DIAGNOSIS — E1122 Type 2 diabetes mellitus with diabetic chronic kidney disease: Secondary | ICD-10-CM | POA: Diagnosis not present

## 2016-11-25 DIAGNOSIS — I214 Non-ST elevation (NSTEMI) myocardial infarction: Secondary | ICD-10-CM | POA: Diagnosis not present

## 2016-11-25 DIAGNOSIS — J45909 Unspecified asthma, uncomplicated: Secondary | ICD-10-CM | POA: Diagnosis not present

## 2016-11-25 DIAGNOSIS — G35 Multiple sclerosis: Secondary | ICD-10-CM | POA: Diagnosis not present

## 2016-11-25 DIAGNOSIS — N182 Chronic kidney disease, stage 2 (mild): Secondary | ICD-10-CM | POA: Diagnosis not present

## 2016-11-25 DIAGNOSIS — S81802D Unspecified open wound, left lower leg, subsequent encounter: Secondary | ICD-10-CM | POA: Diagnosis not present

## 2016-11-25 DIAGNOSIS — I129 Hypertensive chronic kidney disease with stage 1 through stage 4 chronic kidney disease, or unspecified chronic kidney disease: Secondary | ICD-10-CM | POA: Diagnosis not present

## 2016-11-25 DIAGNOSIS — K219 Gastro-esophageal reflux disease without esophagitis: Secondary | ICD-10-CM | POA: Diagnosis not present

## 2016-11-25 DIAGNOSIS — E1122 Type 2 diabetes mellitus with diabetic chronic kidney disease: Secondary | ICD-10-CM | POA: Diagnosis not present

## 2016-11-25 DIAGNOSIS — J441 Chronic obstructive pulmonary disease with (acute) exacerbation: Secondary | ICD-10-CM | POA: Diagnosis not present

## 2016-11-27 DIAGNOSIS — K219 Gastro-esophageal reflux disease without esophagitis: Secondary | ICD-10-CM | POA: Diagnosis not present

## 2016-11-27 DIAGNOSIS — I214 Non-ST elevation (NSTEMI) myocardial infarction: Secondary | ICD-10-CM | POA: Diagnosis not present

## 2016-11-27 DIAGNOSIS — I129 Hypertensive chronic kidney disease with stage 1 through stage 4 chronic kidney disease, or unspecified chronic kidney disease: Secondary | ICD-10-CM | POA: Diagnosis not present

## 2016-11-27 DIAGNOSIS — G35 Multiple sclerosis: Secondary | ICD-10-CM | POA: Diagnosis not present

## 2016-11-27 DIAGNOSIS — J441 Chronic obstructive pulmonary disease with (acute) exacerbation: Secondary | ICD-10-CM | POA: Diagnosis not present

## 2016-11-27 DIAGNOSIS — E1122 Type 2 diabetes mellitus with diabetic chronic kidney disease: Secondary | ICD-10-CM | POA: Diagnosis not present

## 2016-11-27 DIAGNOSIS — N182 Chronic kidney disease, stage 2 (mild): Secondary | ICD-10-CM | POA: Diagnosis not present

## 2016-11-27 DIAGNOSIS — S81802D Unspecified open wound, left lower leg, subsequent encounter: Secondary | ICD-10-CM | POA: Diagnosis not present

## 2016-11-27 DIAGNOSIS — J45909 Unspecified asthma, uncomplicated: Secondary | ICD-10-CM | POA: Diagnosis not present

## 2016-11-30 DIAGNOSIS — J441 Chronic obstructive pulmonary disease with (acute) exacerbation: Secondary | ICD-10-CM | POA: Diagnosis not present

## 2016-11-30 DIAGNOSIS — J45909 Unspecified asthma, uncomplicated: Secondary | ICD-10-CM | POA: Diagnosis not present

## 2016-11-30 DIAGNOSIS — E1122 Type 2 diabetes mellitus with diabetic chronic kidney disease: Secondary | ICD-10-CM | POA: Diagnosis not present

## 2016-11-30 DIAGNOSIS — G35 Multiple sclerosis: Secondary | ICD-10-CM | POA: Diagnosis not present

## 2016-11-30 DIAGNOSIS — N182 Chronic kidney disease, stage 2 (mild): Secondary | ICD-10-CM | POA: Diagnosis not present

## 2016-11-30 DIAGNOSIS — K219 Gastro-esophageal reflux disease without esophagitis: Secondary | ICD-10-CM | POA: Diagnosis not present

## 2016-11-30 DIAGNOSIS — S81802D Unspecified open wound, left lower leg, subsequent encounter: Secondary | ICD-10-CM | POA: Diagnosis not present

## 2016-11-30 DIAGNOSIS — I214 Non-ST elevation (NSTEMI) myocardial infarction: Secondary | ICD-10-CM | POA: Diagnosis not present

## 2016-11-30 DIAGNOSIS — I129 Hypertensive chronic kidney disease with stage 1 through stage 4 chronic kidney disease, or unspecified chronic kidney disease: Secondary | ICD-10-CM | POA: Diagnosis not present

## 2016-12-01 DIAGNOSIS — K219 Gastro-esophageal reflux disease without esophagitis: Secondary | ICD-10-CM | POA: Diagnosis not present

## 2016-12-01 DIAGNOSIS — E1122 Type 2 diabetes mellitus with diabetic chronic kidney disease: Secondary | ICD-10-CM | POA: Diagnosis not present

## 2016-12-01 DIAGNOSIS — S81802D Unspecified open wound, left lower leg, subsequent encounter: Secondary | ICD-10-CM | POA: Diagnosis not present

## 2016-12-01 DIAGNOSIS — J441 Chronic obstructive pulmonary disease with (acute) exacerbation: Secondary | ICD-10-CM | POA: Diagnosis not present

## 2016-12-01 DIAGNOSIS — I129 Hypertensive chronic kidney disease with stage 1 through stage 4 chronic kidney disease, or unspecified chronic kidney disease: Secondary | ICD-10-CM | POA: Diagnosis not present

## 2016-12-01 DIAGNOSIS — N182 Chronic kidney disease, stage 2 (mild): Secondary | ICD-10-CM | POA: Diagnosis not present

## 2016-12-01 DIAGNOSIS — J45909 Unspecified asthma, uncomplicated: Secondary | ICD-10-CM | POA: Diagnosis not present

## 2016-12-01 DIAGNOSIS — I214 Non-ST elevation (NSTEMI) myocardial infarction: Secondary | ICD-10-CM | POA: Diagnosis not present

## 2016-12-01 DIAGNOSIS — G35 Multiple sclerosis: Secondary | ICD-10-CM | POA: Diagnosis not present

## 2016-12-02 DIAGNOSIS — J441 Chronic obstructive pulmonary disease with (acute) exacerbation: Secondary | ICD-10-CM | POA: Diagnosis not present

## 2016-12-02 DIAGNOSIS — J45909 Unspecified asthma, uncomplicated: Secondary | ICD-10-CM | POA: Diagnosis not present

## 2016-12-02 DIAGNOSIS — G35 Multiple sclerosis: Secondary | ICD-10-CM | POA: Diagnosis not present

## 2016-12-02 DIAGNOSIS — E1122 Type 2 diabetes mellitus with diabetic chronic kidney disease: Secondary | ICD-10-CM | POA: Diagnosis not present

## 2016-12-02 DIAGNOSIS — I214 Non-ST elevation (NSTEMI) myocardial infarction: Secondary | ICD-10-CM | POA: Diagnosis not present

## 2016-12-02 DIAGNOSIS — I129 Hypertensive chronic kidney disease with stage 1 through stage 4 chronic kidney disease, or unspecified chronic kidney disease: Secondary | ICD-10-CM | POA: Diagnosis not present

## 2016-12-02 DIAGNOSIS — K219 Gastro-esophageal reflux disease without esophagitis: Secondary | ICD-10-CM | POA: Diagnosis not present

## 2016-12-02 DIAGNOSIS — N182 Chronic kidney disease, stage 2 (mild): Secondary | ICD-10-CM | POA: Diagnosis not present

## 2016-12-02 DIAGNOSIS — S81802D Unspecified open wound, left lower leg, subsequent encounter: Secondary | ICD-10-CM | POA: Diagnosis not present

## 2016-12-03 DIAGNOSIS — E1122 Type 2 diabetes mellitus with diabetic chronic kidney disease: Secondary | ICD-10-CM | POA: Diagnosis not present

## 2016-12-03 DIAGNOSIS — K219 Gastro-esophageal reflux disease without esophagitis: Secondary | ICD-10-CM | POA: Diagnosis not present

## 2016-12-03 DIAGNOSIS — N182 Chronic kidney disease, stage 2 (mild): Secondary | ICD-10-CM | POA: Diagnosis not present

## 2016-12-03 DIAGNOSIS — J45909 Unspecified asthma, uncomplicated: Secondary | ICD-10-CM | POA: Diagnosis not present

## 2016-12-03 DIAGNOSIS — G35 Multiple sclerosis: Secondary | ICD-10-CM | POA: Diagnosis not present

## 2016-12-03 DIAGNOSIS — J441 Chronic obstructive pulmonary disease with (acute) exacerbation: Secondary | ICD-10-CM | POA: Diagnosis not present

## 2016-12-03 DIAGNOSIS — S81802D Unspecified open wound, left lower leg, subsequent encounter: Secondary | ICD-10-CM | POA: Diagnosis not present

## 2016-12-03 DIAGNOSIS — I129 Hypertensive chronic kidney disease with stage 1 through stage 4 chronic kidney disease, or unspecified chronic kidney disease: Secondary | ICD-10-CM | POA: Diagnosis not present

## 2016-12-03 DIAGNOSIS — I214 Non-ST elevation (NSTEMI) myocardial infarction: Secondary | ICD-10-CM | POA: Diagnosis not present

## 2016-12-04 DIAGNOSIS — I214 Non-ST elevation (NSTEMI) myocardial infarction: Secondary | ICD-10-CM | POA: Diagnosis not present

## 2016-12-04 DIAGNOSIS — J45909 Unspecified asthma, uncomplicated: Secondary | ICD-10-CM | POA: Diagnosis not present

## 2016-12-04 DIAGNOSIS — J441 Chronic obstructive pulmonary disease with (acute) exacerbation: Secondary | ICD-10-CM | POA: Diagnosis not present

## 2016-12-04 DIAGNOSIS — K219 Gastro-esophageal reflux disease without esophagitis: Secondary | ICD-10-CM | POA: Diagnosis not present

## 2016-12-04 DIAGNOSIS — E1122 Type 2 diabetes mellitus with diabetic chronic kidney disease: Secondary | ICD-10-CM | POA: Diagnosis not present

## 2016-12-04 DIAGNOSIS — N182 Chronic kidney disease, stage 2 (mild): Secondary | ICD-10-CM | POA: Diagnosis not present

## 2016-12-04 DIAGNOSIS — G35 Multiple sclerosis: Secondary | ICD-10-CM | POA: Diagnosis not present

## 2016-12-04 DIAGNOSIS — I129 Hypertensive chronic kidney disease with stage 1 through stage 4 chronic kidney disease, or unspecified chronic kidney disease: Secondary | ICD-10-CM | POA: Diagnosis not present

## 2016-12-04 DIAGNOSIS — S81802D Unspecified open wound, left lower leg, subsequent encounter: Secondary | ICD-10-CM | POA: Diagnosis not present

## 2016-12-07 DIAGNOSIS — E1122 Type 2 diabetes mellitus with diabetic chronic kidney disease: Secondary | ICD-10-CM | POA: Diagnosis not present

## 2016-12-07 DIAGNOSIS — J9601 Acute respiratory failure with hypoxia: Secondary | ICD-10-CM | POA: Diagnosis not present

## 2016-12-07 DIAGNOSIS — I129 Hypertensive chronic kidney disease with stage 1 through stage 4 chronic kidney disease, or unspecified chronic kidney disease: Secondary | ICD-10-CM | POA: Diagnosis not present

## 2016-12-07 DIAGNOSIS — J449 Chronic obstructive pulmonary disease, unspecified: Secondary | ICD-10-CM | POA: Diagnosis not present

## 2016-12-07 DIAGNOSIS — N182 Chronic kidney disease, stage 2 (mild): Secondary | ICD-10-CM | POA: Diagnosis not present

## 2016-12-07 DIAGNOSIS — G35 Multiple sclerosis: Secondary | ICD-10-CM | POA: Diagnosis not present

## 2016-12-07 DIAGNOSIS — J180 Bronchopneumonia, unspecified organism: Secondary | ICD-10-CM | POA: Diagnosis not present

## 2016-12-07 DIAGNOSIS — S81802D Unspecified open wound, left lower leg, subsequent encounter: Secondary | ICD-10-CM | POA: Diagnosis not present

## 2016-12-07 DIAGNOSIS — I214 Non-ST elevation (NSTEMI) myocardial infarction: Secondary | ICD-10-CM | POA: Diagnosis not present

## 2016-12-07 DIAGNOSIS — J441 Chronic obstructive pulmonary disease with (acute) exacerbation: Secondary | ICD-10-CM | POA: Diagnosis not present

## 2016-12-07 DIAGNOSIS — K219 Gastro-esophageal reflux disease without esophagitis: Secondary | ICD-10-CM | POA: Diagnosis not present

## 2016-12-07 DIAGNOSIS — J45909 Unspecified asthma, uncomplicated: Secondary | ICD-10-CM | POA: Diagnosis not present

## 2016-12-08 DIAGNOSIS — E1122 Type 2 diabetes mellitus with diabetic chronic kidney disease: Secondary | ICD-10-CM | POA: Diagnosis not present

## 2016-12-08 DIAGNOSIS — I129 Hypertensive chronic kidney disease with stage 1 through stage 4 chronic kidney disease, or unspecified chronic kidney disease: Secondary | ICD-10-CM | POA: Diagnosis not present

## 2016-12-08 DIAGNOSIS — I214 Non-ST elevation (NSTEMI) myocardial infarction: Secondary | ICD-10-CM | POA: Diagnosis not present

## 2016-12-08 DIAGNOSIS — J441 Chronic obstructive pulmonary disease with (acute) exacerbation: Secondary | ICD-10-CM | POA: Diagnosis not present

## 2016-12-08 DIAGNOSIS — G35 Multiple sclerosis: Secondary | ICD-10-CM | POA: Diagnosis not present

## 2016-12-08 DIAGNOSIS — J45909 Unspecified asthma, uncomplicated: Secondary | ICD-10-CM | POA: Diagnosis not present

## 2016-12-08 DIAGNOSIS — S81802D Unspecified open wound, left lower leg, subsequent encounter: Secondary | ICD-10-CM | POA: Diagnosis not present

## 2016-12-08 DIAGNOSIS — N182 Chronic kidney disease, stage 2 (mild): Secondary | ICD-10-CM | POA: Diagnosis not present

## 2016-12-08 DIAGNOSIS — K219 Gastro-esophageal reflux disease without esophagitis: Secondary | ICD-10-CM | POA: Diagnosis not present

## 2016-12-09 DIAGNOSIS — G35 Multiple sclerosis: Secondary | ICD-10-CM | POA: Diagnosis not present

## 2016-12-09 DIAGNOSIS — I129 Hypertensive chronic kidney disease with stage 1 through stage 4 chronic kidney disease, or unspecified chronic kidney disease: Secondary | ICD-10-CM | POA: Diagnosis not present

## 2016-12-09 DIAGNOSIS — K219 Gastro-esophageal reflux disease without esophagitis: Secondary | ICD-10-CM | POA: Diagnosis not present

## 2016-12-09 DIAGNOSIS — N182 Chronic kidney disease, stage 2 (mild): Secondary | ICD-10-CM | POA: Diagnosis not present

## 2016-12-09 DIAGNOSIS — J441 Chronic obstructive pulmonary disease with (acute) exacerbation: Secondary | ICD-10-CM | POA: Diagnosis not present

## 2016-12-09 DIAGNOSIS — I214 Non-ST elevation (NSTEMI) myocardial infarction: Secondary | ICD-10-CM | POA: Diagnosis not present

## 2016-12-09 DIAGNOSIS — J45909 Unspecified asthma, uncomplicated: Secondary | ICD-10-CM | POA: Diagnosis not present

## 2016-12-09 DIAGNOSIS — E1122 Type 2 diabetes mellitus with diabetic chronic kidney disease: Secondary | ICD-10-CM | POA: Diagnosis not present

## 2016-12-09 DIAGNOSIS — S81802D Unspecified open wound, left lower leg, subsequent encounter: Secondary | ICD-10-CM | POA: Diagnosis not present

## 2016-12-10 DIAGNOSIS — J441 Chronic obstructive pulmonary disease with (acute) exacerbation: Secondary | ICD-10-CM | POA: Diagnosis not present

## 2016-12-10 DIAGNOSIS — E860 Dehydration: Secondary | ICD-10-CM | POA: Diagnosis not present

## 2016-12-10 DIAGNOSIS — J449 Chronic obstructive pulmonary disease, unspecified: Secondary | ICD-10-CM | POA: Diagnosis not present

## 2016-12-10 DIAGNOSIS — N183 Chronic kidney disease, stage 3 (moderate): Secondary | ICD-10-CM | POA: Diagnosis not present

## 2016-12-10 DIAGNOSIS — M79605 Pain in left leg: Secondary | ICD-10-CM | POA: Diagnosis not present

## 2016-12-10 DIAGNOSIS — J180 Bronchopneumonia, unspecified organism: Secondary | ICD-10-CM | POA: Diagnosis not present

## 2016-12-10 DIAGNOSIS — Z6836 Body mass index (BMI) 36.0-36.9, adult: Secondary | ICD-10-CM | POA: Diagnosis not present

## 2016-12-10 DIAGNOSIS — J9601 Acute respiratory failure with hypoxia: Secondary | ICD-10-CM | POA: Diagnosis not present

## 2016-12-11 DIAGNOSIS — G35 Multiple sclerosis: Secondary | ICD-10-CM | POA: Diagnosis not present

## 2016-12-11 DIAGNOSIS — J45909 Unspecified asthma, uncomplicated: Secondary | ICD-10-CM | POA: Diagnosis not present

## 2016-12-11 DIAGNOSIS — S81802D Unspecified open wound, left lower leg, subsequent encounter: Secondary | ICD-10-CM | POA: Diagnosis not present

## 2016-12-11 DIAGNOSIS — N182 Chronic kidney disease, stage 2 (mild): Secondary | ICD-10-CM | POA: Diagnosis not present

## 2016-12-11 DIAGNOSIS — J441 Chronic obstructive pulmonary disease with (acute) exacerbation: Secondary | ICD-10-CM | POA: Diagnosis not present

## 2016-12-11 DIAGNOSIS — K219 Gastro-esophageal reflux disease without esophagitis: Secondary | ICD-10-CM | POA: Diagnosis not present

## 2016-12-11 DIAGNOSIS — J449 Chronic obstructive pulmonary disease, unspecified: Secondary | ICD-10-CM | POA: Diagnosis not present

## 2016-12-11 DIAGNOSIS — I129 Hypertensive chronic kidney disease with stage 1 through stage 4 chronic kidney disease, or unspecified chronic kidney disease: Secondary | ICD-10-CM | POA: Diagnosis not present

## 2016-12-11 DIAGNOSIS — E1122 Type 2 diabetes mellitus with diabetic chronic kidney disease: Secondary | ICD-10-CM | POA: Diagnosis not present

## 2016-12-11 DIAGNOSIS — I214 Non-ST elevation (NSTEMI) myocardial infarction: Secondary | ICD-10-CM | POA: Diagnosis not present

## 2016-12-14 DIAGNOSIS — I214 Non-ST elevation (NSTEMI) myocardial infarction: Secondary | ICD-10-CM | POA: Diagnosis not present

## 2016-12-14 DIAGNOSIS — S81802D Unspecified open wound, left lower leg, subsequent encounter: Secondary | ICD-10-CM | POA: Diagnosis not present

## 2016-12-14 DIAGNOSIS — N182 Chronic kidney disease, stage 2 (mild): Secondary | ICD-10-CM | POA: Diagnosis not present

## 2016-12-14 DIAGNOSIS — J441 Chronic obstructive pulmonary disease with (acute) exacerbation: Secondary | ICD-10-CM | POA: Diagnosis not present

## 2016-12-14 DIAGNOSIS — G35 Multiple sclerosis: Secondary | ICD-10-CM | POA: Diagnosis not present

## 2016-12-14 DIAGNOSIS — I129 Hypertensive chronic kidney disease with stage 1 through stage 4 chronic kidney disease, or unspecified chronic kidney disease: Secondary | ICD-10-CM | POA: Diagnosis not present

## 2016-12-14 DIAGNOSIS — J45909 Unspecified asthma, uncomplicated: Secondary | ICD-10-CM | POA: Diagnosis not present

## 2016-12-14 DIAGNOSIS — K219 Gastro-esophageal reflux disease without esophagitis: Secondary | ICD-10-CM | POA: Diagnosis not present

## 2016-12-14 DIAGNOSIS — E1122 Type 2 diabetes mellitus with diabetic chronic kidney disease: Secondary | ICD-10-CM | POA: Diagnosis not present

## 2016-12-15 DIAGNOSIS — E1122 Type 2 diabetes mellitus with diabetic chronic kidney disease: Secondary | ICD-10-CM | POA: Diagnosis not present

## 2016-12-15 DIAGNOSIS — J45909 Unspecified asthma, uncomplicated: Secondary | ICD-10-CM | POA: Diagnosis not present

## 2016-12-15 DIAGNOSIS — N182 Chronic kidney disease, stage 2 (mild): Secondary | ICD-10-CM | POA: Diagnosis not present

## 2016-12-15 DIAGNOSIS — K219 Gastro-esophageal reflux disease without esophagitis: Secondary | ICD-10-CM | POA: Diagnosis not present

## 2016-12-15 DIAGNOSIS — G35 Multiple sclerosis: Secondary | ICD-10-CM | POA: Diagnosis not present

## 2016-12-15 DIAGNOSIS — I129 Hypertensive chronic kidney disease with stage 1 through stage 4 chronic kidney disease, or unspecified chronic kidney disease: Secondary | ICD-10-CM | POA: Diagnosis not present

## 2016-12-15 DIAGNOSIS — S81802D Unspecified open wound, left lower leg, subsequent encounter: Secondary | ICD-10-CM | POA: Diagnosis not present

## 2016-12-15 DIAGNOSIS — I214 Non-ST elevation (NSTEMI) myocardial infarction: Secondary | ICD-10-CM | POA: Diagnosis not present

## 2016-12-15 DIAGNOSIS — J441 Chronic obstructive pulmonary disease with (acute) exacerbation: Secondary | ICD-10-CM | POA: Diagnosis not present

## 2016-12-16 DIAGNOSIS — F1120 Opioid dependence, uncomplicated: Secondary | ICD-10-CM | POA: Diagnosis not present

## 2016-12-16 DIAGNOSIS — S32008A Other fracture of unspecified lumbar vertebra, initial encounter for closed fracture: Secondary | ICD-10-CM | POA: Diagnosis not present

## 2016-12-16 DIAGNOSIS — Z79891 Long term (current) use of opiate analgesic: Secondary | ICD-10-CM | POA: Diagnosis not present

## 2016-12-16 DIAGNOSIS — G894 Chronic pain syndrome: Secondary | ICD-10-CM | POA: Diagnosis not present

## 2016-12-16 DIAGNOSIS — J449 Chronic obstructive pulmonary disease, unspecified: Secondary | ICD-10-CM | POA: Diagnosis not present

## 2016-12-16 DIAGNOSIS — Q761 Klippel-Feil syndrome: Secondary | ICD-10-CM | POA: Diagnosis not present

## 2016-12-16 DIAGNOSIS — M961 Postlaminectomy syndrome, not elsewhere classified: Secondary | ICD-10-CM | POA: Diagnosis not present

## 2016-12-17 DIAGNOSIS — R748 Abnormal levels of other serum enzymes: Secondary | ICD-10-CM | POA: Diagnosis not present

## 2016-12-17 DIAGNOSIS — R0602 Shortness of breath: Secondary | ICD-10-CM | POA: Diagnosis not present

## 2016-12-17 DIAGNOSIS — J449 Chronic obstructive pulmonary disease, unspecified: Secondary | ICD-10-CM | POA: Diagnosis not present

## 2016-12-21 DIAGNOSIS — K219 Gastro-esophageal reflux disease without esophagitis: Secondary | ICD-10-CM | POA: Diagnosis not present

## 2016-12-21 DIAGNOSIS — S81802D Unspecified open wound, left lower leg, subsequent encounter: Secondary | ICD-10-CM | POA: Diagnosis not present

## 2016-12-21 DIAGNOSIS — J441 Chronic obstructive pulmonary disease with (acute) exacerbation: Secondary | ICD-10-CM | POA: Diagnosis not present

## 2016-12-21 DIAGNOSIS — J45909 Unspecified asthma, uncomplicated: Secondary | ICD-10-CM | POA: Diagnosis not present

## 2016-12-21 DIAGNOSIS — E1122 Type 2 diabetes mellitus with diabetic chronic kidney disease: Secondary | ICD-10-CM | POA: Diagnosis not present

## 2016-12-21 DIAGNOSIS — N182 Chronic kidney disease, stage 2 (mild): Secondary | ICD-10-CM | POA: Diagnosis not present

## 2016-12-21 DIAGNOSIS — G35 Multiple sclerosis: Secondary | ICD-10-CM | POA: Diagnosis not present

## 2016-12-21 DIAGNOSIS — I129 Hypertensive chronic kidney disease with stage 1 through stage 4 chronic kidney disease, or unspecified chronic kidney disease: Secondary | ICD-10-CM | POA: Diagnosis not present

## 2016-12-21 DIAGNOSIS — I214 Non-ST elevation (NSTEMI) myocardial infarction: Secondary | ICD-10-CM | POA: Diagnosis not present

## 2017-01-06 DIAGNOSIS — J9601 Acute respiratory failure with hypoxia: Secondary | ICD-10-CM | POA: Diagnosis not present

## 2017-01-06 DIAGNOSIS — J449 Chronic obstructive pulmonary disease, unspecified: Secondary | ICD-10-CM | POA: Diagnosis not present

## 2017-01-06 DIAGNOSIS — J441 Chronic obstructive pulmonary disease with (acute) exacerbation: Secondary | ICD-10-CM | POA: Diagnosis not present

## 2017-01-06 DIAGNOSIS — J180 Bronchopneumonia, unspecified organism: Secondary | ICD-10-CM | POA: Diagnosis not present

## 2017-01-10 DIAGNOSIS — J180 Bronchopneumonia, unspecified organism: Secondary | ICD-10-CM | POA: Diagnosis not present

## 2017-01-10 DIAGNOSIS — J9601 Acute respiratory failure with hypoxia: Secondary | ICD-10-CM | POA: Diagnosis not present

## 2017-01-10 DIAGNOSIS — J441 Chronic obstructive pulmonary disease with (acute) exacerbation: Secondary | ICD-10-CM | POA: Diagnosis not present

## 2017-01-10 DIAGNOSIS — E860 Dehydration: Secondary | ICD-10-CM | POA: Diagnosis not present

## 2017-01-13 ENCOUNTER — Ambulatory Visit: Payer: Medicare HMO | Admitting: Pulmonary Disease

## 2017-01-14 DIAGNOSIS — Z79891 Long term (current) use of opiate analgesic: Secondary | ICD-10-CM | POA: Diagnosis not present

## 2017-01-14 DIAGNOSIS — G894 Chronic pain syndrome: Secondary | ICD-10-CM | POA: Diagnosis not present

## 2017-01-14 DIAGNOSIS — S32008A Other fracture of unspecified lumbar vertebra, initial encounter for closed fracture: Secondary | ICD-10-CM | POA: Diagnosis not present

## 2017-01-14 DIAGNOSIS — Q761 Klippel-Feil syndrome: Secondary | ICD-10-CM | POA: Diagnosis not present

## 2017-01-14 DIAGNOSIS — M961 Postlaminectomy syndrome, not elsewhere classified: Secondary | ICD-10-CM | POA: Diagnosis not present

## 2017-01-14 DIAGNOSIS — J449 Chronic obstructive pulmonary disease, unspecified: Secondary | ICD-10-CM | POA: Diagnosis not present

## 2017-01-14 DIAGNOSIS — F1120 Opioid dependence, uncomplicated: Secondary | ICD-10-CM | POA: Diagnosis not present

## 2017-01-26 DIAGNOSIS — D649 Anemia, unspecified: Secondary | ICD-10-CM | POA: Diagnosis not present

## 2017-01-26 DIAGNOSIS — Z139 Encounter for screening, unspecified: Secondary | ICD-10-CM | POA: Diagnosis not present

## 2017-01-26 DIAGNOSIS — N183 Chronic kidney disease, stage 3 (moderate): Secondary | ICD-10-CM | POA: Diagnosis not present

## 2017-01-26 DIAGNOSIS — M7989 Other specified soft tissue disorders: Secondary | ICD-10-CM | POA: Diagnosis not present

## 2017-01-27 DIAGNOSIS — D649 Anemia, unspecified: Secondary | ICD-10-CM | POA: Diagnosis not present

## 2017-01-27 DIAGNOSIS — F3160 Bipolar disorder, current episode mixed, unspecified: Secondary | ICD-10-CM | POA: Diagnosis not present

## 2017-01-27 DIAGNOSIS — I10 Essential (primary) hypertension: Secondary | ICD-10-CM | POA: Diagnosis not present

## 2017-01-27 DIAGNOSIS — R0602 Shortness of breath: Secondary | ICD-10-CM | POA: Diagnosis not present

## 2017-01-27 DIAGNOSIS — M79609 Pain in unspecified limb: Secondary | ICD-10-CM | POA: Diagnosis not present

## 2017-01-27 DIAGNOSIS — N183 Chronic kidney disease, stage 3 (moderate): Secondary | ICD-10-CM | POA: Diagnosis not present

## 2017-01-28 DIAGNOSIS — N183 Chronic kidney disease, stage 3 (moderate): Secondary | ICD-10-CM | POA: Diagnosis not present

## 2017-01-28 DIAGNOSIS — Z6837 Body mass index (BMI) 37.0-37.9, adult: Secondary | ICD-10-CM | POA: Diagnosis not present

## 2017-01-28 DIAGNOSIS — D649 Anemia, unspecified: Secondary | ICD-10-CM | POA: Diagnosis not present

## 2017-01-29 DIAGNOSIS — D649 Anemia, unspecified: Secondary | ICD-10-CM | POA: Diagnosis not present

## 2017-02-02 DIAGNOSIS — Z9189 Other specified personal risk factors, not elsewhere classified: Secondary | ICD-10-CM | POA: Diagnosis not present

## 2017-02-02 DIAGNOSIS — D649 Anemia, unspecified: Secondary | ICD-10-CM | POA: Diagnosis not present

## 2017-02-02 DIAGNOSIS — Z6837 Body mass index (BMI) 37.0-37.9, adult: Secondary | ICD-10-CM | POA: Diagnosis not present

## 2017-02-05 DIAGNOSIS — N183 Chronic kidney disease, stage 3 (moderate): Secondary | ICD-10-CM | POA: Diagnosis not present

## 2017-02-05 DIAGNOSIS — I10 Essential (primary) hypertension: Secondary | ICD-10-CM | POA: Diagnosis not present

## 2017-02-05 DIAGNOSIS — N390 Urinary tract infection, site not specified: Secondary | ICD-10-CM | POA: Diagnosis not present

## 2017-02-05 DIAGNOSIS — R609 Edema, unspecified: Secondary | ICD-10-CM | POA: Diagnosis not present

## 2017-02-06 DIAGNOSIS — J180 Bronchopneumonia, unspecified organism: Secondary | ICD-10-CM | POA: Diagnosis not present

## 2017-02-06 DIAGNOSIS — J441 Chronic obstructive pulmonary disease with (acute) exacerbation: Secondary | ICD-10-CM | POA: Diagnosis not present

## 2017-02-06 DIAGNOSIS — J449 Chronic obstructive pulmonary disease, unspecified: Secondary | ICD-10-CM | POA: Diagnosis not present

## 2017-02-06 DIAGNOSIS — J9601 Acute respiratory failure with hypoxia: Secondary | ICD-10-CM | POA: Diagnosis not present

## 2017-02-09 DIAGNOSIS — J441 Chronic obstructive pulmonary disease with (acute) exacerbation: Secondary | ICD-10-CM | POA: Diagnosis not present

## 2017-02-09 DIAGNOSIS — J9601 Acute respiratory failure with hypoxia: Secondary | ICD-10-CM | POA: Diagnosis not present

## 2017-02-09 DIAGNOSIS — E860 Dehydration: Secondary | ICD-10-CM | POA: Diagnosis not present

## 2017-02-09 DIAGNOSIS — J180 Bronchopneumonia, unspecified organism: Secondary | ICD-10-CM | POA: Diagnosis not present

## 2017-02-11 DIAGNOSIS — S32008A Other fracture of unspecified lumbar vertebra, initial encounter for closed fracture: Secondary | ICD-10-CM | POA: Diagnosis not present

## 2017-02-11 DIAGNOSIS — Q761 Klippel-Feil syndrome: Secondary | ICD-10-CM | POA: Diagnosis not present

## 2017-02-11 DIAGNOSIS — G894 Chronic pain syndrome: Secondary | ICD-10-CM | POA: Diagnosis not present

## 2017-02-11 DIAGNOSIS — Z79891 Long term (current) use of opiate analgesic: Secondary | ICD-10-CM | POA: Diagnosis not present

## 2017-02-11 DIAGNOSIS — Z6838 Body mass index (BMI) 38.0-38.9, adult: Secondary | ICD-10-CM | POA: Diagnosis not present

## 2017-02-11 DIAGNOSIS — J449 Chronic obstructive pulmonary disease, unspecified: Secondary | ICD-10-CM | POA: Diagnosis not present

## 2017-02-11 DIAGNOSIS — D649 Anemia, unspecified: Secondary | ICD-10-CM | POA: Diagnosis not present

## 2017-02-11 DIAGNOSIS — M961 Postlaminectomy syndrome, not elsewhere classified: Secondary | ICD-10-CM | POA: Diagnosis not present

## 2017-02-11 DIAGNOSIS — F17200 Nicotine dependence, unspecified, uncomplicated: Secondary | ICD-10-CM | POA: Diagnosis not present

## 2017-02-11 DIAGNOSIS — F1120 Opioid dependence, uncomplicated: Secondary | ICD-10-CM | POA: Diagnosis not present

## 2017-02-12 DIAGNOSIS — K648 Other hemorrhoids: Secondary | ICD-10-CM | POA: Diagnosis not present

## 2017-02-12 DIAGNOSIS — K250 Acute gastric ulcer with hemorrhage: Secondary | ICD-10-CM | POA: Diagnosis not present

## 2017-02-12 DIAGNOSIS — K2970 Gastritis, unspecified, without bleeding: Secondary | ICD-10-CM | POA: Diagnosis not present

## 2017-02-12 DIAGNOSIS — D500 Iron deficiency anemia secondary to blood loss (chronic): Secondary | ICD-10-CM | POA: Diagnosis not present

## 2017-02-12 DIAGNOSIS — R0602 Shortness of breath: Secondary | ICD-10-CM | POA: Diagnosis not present

## 2017-02-12 DIAGNOSIS — Z7901 Long term (current) use of anticoagulants: Secondary | ICD-10-CM | POA: Diagnosis not present

## 2017-02-12 DIAGNOSIS — D123 Benign neoplasm of transverse colon: Secondary | ICD-10-CM | POA: Diagnosis not present

## 2017-02-12 DIAGNOSIS — K30 Functional dyspepsia: Secondary | ICD-10-CM | POA: Diagnosis not present

## 2017-02-12 DIAGNOSIS — K259 Gastric ulcer, unspecified as acute or chronic, without hemorrhage or perforation: Secondary | ICD-10-CM | POA: Diagnosis not present

## 2017-02-12 DIAGNOSIS — J441 Chronic obstructive pulmonary disease with (acute) exacerbation: Secondary | ICD-10-CM | POA: Diagnosis not present

## 2017-02-12 DIAGNOSIS — G35 Multiple sclerosis: Secondary | ICD-10-CM | POA: Diagnosis not present

## 2017-02-12 DIAGNOSIS — D649 Anemia, unspecified: Secondary | ICD-10-CM | POA: Diagnosis not present

## 2017-02-12 DIAGNOSIS — K5730 Diverticulosis of large intestine without perforation or abscess without bleeding: Secondary | ICD-10-CM | POA: Diagnosis not present

## 2017-02-12 DIAGNOSIS — I10 Essential (primary) hypertension: Secondary | ICD-10-CM | POA: Diagnosis not present

## 2017-02-12 DIAGNOSIS — R195 Other fecal abnormalities: Secondary | ICD-10-CM | POA: Diagnosis not present

## 2017-02-12 DIAGNOSIS — N183 Chronic kidney disease, stage 3 (moderate): Secondary | ICD-10-CM | POA: Diagnosis not present

## 2017-02-12 DIAGNOSIS — D62 Acute posthemorrhagic anemia: Secondary | ICD-10-CM | POA: Diagnosis not present

## 2017-02-12 DIAGNOSIS — F17210 Nicotine dependence, cigarettes, uncomplicated: Secondary | ICD-10-CM | POA: Diagnosis not present

## 2017-02-12 DIAGNOSIS — J449 Chronic obstructive pulmonary disease, unspecified: Secondary | ICD-10-CM | POA: Diagnosis not present

## 2017-02-12 DIAGNOSIS — R079 Chest pain, unspecified: Secondary | ICD-10-CM | POA: Diagnosis not present

## 2017-02-12 DIAGNOSIS — E559 Vitamin D deficiency, unspecified: Secondary | ICD-10-CM | POA: Diagnosis not present

## 2017-02-12 DIAGNOSIS — K208 Other esophagitis: Secondary | ICD-10-CM | POA: Diagnosis not present

## 2017-02-12 DIAGNOSIS — E8771 Transfusion associated circulatory overload: Secondary | ICD-10-CM | POA: Diagnosis not present

## 2017-02-12 DIAGNOSIS — J9621 Acute and chronic respiratory failure with hypoxia: Secondary | ICD-10-CM | POA: Diagnosis not present

## 2017-02-12 DIAGNOSIS — J9611 Chronic respiratory failure with hypoxia: Secondary | ICD-10-CM | POA: Diagnosis not present

## 2017-02-12 DIAGNOSIS — J9612 Chronic respiratory failure with hypercapnia: Secondary | ICD-10-CM | POA: Diagnosis not present

## 2017-02-12 DIAGNOSIS — K209 Esophagitis, unspecified: Secondary | ICD-10-CM | POA: Diagnosis not present

## 2017-02-12 DIAGNOSIS — R05 Cough: Secondary | ICD-10-CM | POA: Diagnosis not present

## 2017-02-12 DIAGNOSIS — K449 Diaphragmatic hernia without obstruction or gangrene: Secondary | ICD-10-CM | POA: Diagnosis not present

## 2017-02-12 DIAGNOSIS — I5031 Acute diastolic (congestive) heart failure: Secondary | ICD-10-CM | POA: Diagnosis not present

## 2017-02-12 DIAGNOSIS — D125 Benign neoplasm of sigmoid colon: Secondary | ICD-10-CM | POA: Diagnosis not present

## 2017-02-12 DIAGNOSIS — J9622 Acute and chronic respiratory failure with hypercapnia: Secondary | ICD-10-CM | POA: Diagnosis not present

## 2017-02-23 DIAGNOSIS — Z6836 Body mass index (BMI) 36.0-36.9, adult: Secondary | ICD-10-CM | POA: Diagnosis not present

## 2017-02-23 DIAGNOSIS — J449 Chronic obstructive pulmonary disease, unspecified: Secondary | ICD-10-CM | POA: Diagnosis not present

## 2017-02-24 DIAGNOSIS — D631 Anemia in chronic kidney disease: Secondary | ICD-10-CM | POA: Diagnosis not present

## 2017-02-24 DIAGNOSIS — N183 Chronic kidney disease, stage 3 (moderate): Secondary | ICD-10-CM | POA: Diagnosis not present

## 2017-02-24 DIAGNOSIS — M1712 Unilateral primary osteoarthritis, left knee: Secondary | ICD-10-CM | POA: Diagnosis not present

## 2017-03-02 DIAGNOSIS — D509 Iron deficiency anemia, unspecified: Secondary | ICD-10-CM | POA: Diagnosis not present

## 2017-03-02 DIAGNOSIS — Z86010 Personal history of colonic polyps: Secondary | ICD-10-CM | POA: Diagnosis not present

## 2017-03-03 DIAGNOSIS — E559 Vitamin D deficiency, unspecified: Secondary | ICD-10-CM | POA: Diagnosis not present

## 2017-03-03 DIAGNOSIS — J449 Chronic obstructive pulmonary disease, unspecified: Secondary | ICD-10-CM | POA: Diagnosis not present

## 2017-03-03 DIAGNOSIS — R5383 Other fatigue: Secondary | ICD-10-CM | POA: Diagnosis not present

## 2017-03-03 DIAGNOSIS — F17210 Nicotine dependence, cigarettes, uncomplicated: Secondary | ICD-10-CM | POA: Diagnosis not present

## 2017-03-03 DIAGNOSIS — G4733 Obstructive sleep apnea (adult) (pediatric): Secondary | ICD-10-CM | POA: Diagnosis not present

## 2017-03-05 DIAGNOSIS — N183 Chronic kidney disease, stage 3 (moderate): Secondary | ICD-10-CM | POA: Diagnosis not present

## 2017-03-05 DIAGNOSIS — D631 Anemia in chronic kidney disease: Secondary | ICD-10-CM | POA: Diagnosis not present

## 2017-03-08 DIAGNOSIS — J449 Chronic obstructive pulmonary disease, unspecified: Secondary | ICD-10-CM | POA: Diagnosis not present

## 2017-03-08 DIAGNOSIS — J441 Chronic obstructive pulmonary disease with (acute) exacerbation: Secondary | ICD-10-CM | POA: Diagnosis not present

## 2017-03-08 DIAGNOSIS — J180 Bronchopneumonia, unspecified organism: Secondary | ICD-10-CM | POA: Diagnosis not present

## 2017-03-08 DIAGNOSIS — J9601 Acute respiratory failure with hypoxia: Secondary | ICD-10-CM | POA: Diagnosis not present

## 2017-03-11 DIAGNOSIS — G894 Chronic pain syndrome: Secondary | ICD-10-CM | POA: Diagnosis not present

## 2017-03-11 DIAGNOSIS — S32008A Other fracture of unspecified lumbar vertebra, initial encounter for closed fracture: Secondary | ICD-10-CM | POA: Diagnosis not present

## 2017-03-11 DIAGNOSIS — Q761 Klippel-Feil syndrome: Secondary | ICD-10-CM | POA: Diagnosis not present

## 2017-03-11 DIAGNOSIS — M961 Postlaminectomy syndrome, not elsewhere classified: Secondary | ICD-10-CM | POA: Diagnosis not present

## 2017-03-11 DIAGNOSIS — J449 Chronic obstructive pulmonary disease, unspecified: Secondary | ICD-10-CM | POA: Diagnosis not present

## 2017-03-11 DIAGNOSIS — F1120 Opioid dependence, uncomplicated: Secondary | ICD-10-CM | POA: Diagnosis not present

## 2017-03-11 DIAGNOSIS — Z79891 Long term (current) use of opiate analgesic: Secondary | ICD-10-CM | POA: Diagnosis not present

## 2017-03-12 DIAGNOSIS — J180 Bronchopneumonia, unspecified organism: Secondary | ICD-10-CM | POA: Diagnosis not present

## 2017-03-12 DIAGNOSIS — N183 Chronic kidney disease, stage 3 (moderate): Secondary | ICD-10-CM | POA: Diagnosis not present

## 2017-03-12 DIAGNOSIS — E860 Dehydration: Secondary | ICD-10-CM | POA: Diagnosis not present

## 2017-03-12 DIAGNOSIS — J441 Chronic obstructive pulmonary disease with (acute) exacerbation: Secondary | ICD-10-CM | POA: Diagnosis not present

## 2017-03-12 DIAGNOSIS — D631 Anemia in chronic kidney disease: Secondary | ICD-10-CM | POA: Diagnosis not present

## 2017-03-12 DIAGNOSIS — J9601 Acute respiratory failure with hypoxia: Secondary | ICD-10-CM | POA: Diagnosis not present

## 2017-03-16 DIAGNOSIS — Z1389 Encounter for screening for other disorder: Secondary | ICD-10-CM | POA: Diagnosis not present

## 2017-03-16 DIAGNOSIS — J305 Allergic rhinitis due to food: Secondary | ICD-10-CM | POA: Diagnosis not present

## 2017-03-16 DIAGNOSIS — J309 Allergic rhinitis, unspecified: Secondary | ICD-10-CM | POA: Diagnosis not present

## 2017-03-16 DIAGNOSIS — J302 Other seasonal allergic rhinitis: Secondary | ICD-10-CM | POA: Diagnosis not present

## 2017-03-16 DIAGNOSIS — D649 Anemia, unspecified: Secondary | ICD-10-CM | POA: Diagnosis not present

## 2017-03-16 DIAGNOSIS — Z139 Encounter for screening, unspecified: Secondary | ICD-10-CM | POA: Diagnosis not present

## 2017-03-16 DIAGNOSIS — J449 Chronic obstructive pulmonary disease, unspecified: Secondary | ICD-10-CM | POA: Diagnosis not present

## 2017-03-16 DIAGNOSIS — Z9981 Dependence on supplemental oxygen: Secondary | ICD-10-CM | POA: Diagnosis not present

## 2017-03-16 DIAGNOSIS — J9610 Chronic respiratory failure, unspecified whether with hypoxia or hypercapnia: Secondary | ICD-10-CM | POA: Diagnosis not present

## 2017-03-17 DIAGNOSIS — J305 Allergic rhinitis due to food: Secondary | ICD-10-CM | POA: Diagnosis not present

## 2017-03-17 DIAGNOSIS — J309 Allergic rhinitis, unspecified: Secondary | ICD-10-CM | POA: Diagnosis not present

## 2017-03-17 DIAGNOSIS — J302 Other seasonal allergic rhinitis: Secondary | ICD-10-CM | POA: Diagnosis not present

## 2017-03-18 DIAGNOSIS — J305 Allergic rhinitis due to food: Secondary | ICD-10-CM | POA: Diagnosis not present

## 2017-03-18 DIAGNOSIS — J309 Allergic rhinitis, unspecified: Secondary | ICD-10-CM | POA: Diagnosis not present

## 2017-03-18 DIAGNOSIS — J302 Other seasonal allergic rhinitis: Secondary | ICD-10-CM | POA: Diagnosis not present

## 2017-03-19 DIAGNOSIS — J305 Allergic rhinitis due to food: Secondary | ICD-10-CM | POA: Diagnosis not present

## 2017-03-19 DIAGNOSIS — J302 Other seasonal allergic rhinitis: Secondary | ICD-10-CM | POA: Diagnosis not present

## 2017-03-19 DIAGNOSIS — J309 Allergic rhinitis, unspecified: Secondary | ICD-10-CM | POA: Diagnosis not present

## 2017-03-22 DIAGNOSIS — J302 Other seasonal allergic rhinitis: Secondary | ICD-10-CM | POA: Diagnosis not present

## 2017-03-22 DIAGNOSIS — J305 Allergic rhinitis due to food: Secondary | ICD-10-CM | POA: Diagnosis not present

## 2017-03-22 DIAGNOSIS — J309 Allergic rhinitis, unspecified: Secondary | ICD-10-CM | POA: Diagnosis not present

## 2017-03-23 DIAGNOSIS — D631 Anemia in chronic kidney disease: Secondary | ICD-10-CM | POA: Diagnosis not present

## 2017-03-23 DIAGNOSIS — N183 Chronic kidney disease, stage 3 (moderate): Secondary | ICD-10-CM | POA: Diagnosis not present

## 2017-03-23 DIAGNOSIS — J309 Allergic rhinitis, unspecified: Secondary | ICD-10-CM | POA: Diagnosis not present

## 2017-03-23 DIAGNOSIS — J302 Other seasonal allergic rhinitis: Secondary | ICD-10-CM | POA: Diagnosis not present

## 2017-03-23 DIAGNOSIS — J305 Allergic rhinitis due to food: Secondary | ICD-10-CM | POA: Diagnosis not present

## 2017-03-24 DIAGNOSIS — J309 Allergic rhinitis, unspecified: Secondary | ICD-10-CM | POA: Diagnosis not present

## 2017-03-24 DIAGNOSIS — J302 Other seasonal allergic rhinitis: Secondary | ICD-10-CM | POA: Diagnosis not present

## 2017-03-24 DIAGNOSIS — J305 Allergic rhinitis due to food: Secondary | ICD-10-CM | POA: Diagnosis not present

## 2017-03-25 DIAGNOSIS — J302 Other seasonal allergic rhinitis: Secondary | ICD-10-CM | POA: Diagnosis not present

## 2017-03-25 DIAGNOSIS — J309 Allergic rhinitis, unspecified: Secondary | ICD-10-CM | POA: Diagnosis not present

## 2017-03-25 DIAGNOSIS — J305 Allergic rhinitis due to food: Secondary | ICD-10-CM | POA: Diagnosis not present

## 2017-03-28 DIAGNOSIS — G4733 Obstructive sleep apnea (adult) (pediatric): Secondary | ICD-10-CM | POA: Diagnosis not present

## 2017-04-06 DIAGNOSIS — Z86010 Personal history of colonic polyps: Secondary | ICD-10-CM | POA: Diagnosis not present

## 2017-04-06 DIAGNOSIS — D509 Iron deficiency anemia, unspecified: Secondary | ICD-10-CM | POA: Diagnosis not present

## 2017-04-07 DIAGNOSIS — G4733 Obstructive sleep apnea (adult) (pediatric): Secondary | ICD-10-CM | POA: Diagnosis not present

## 2017-04-07 DIAGNOSIS — J449 Chronic obstructive pulmonary disease, unspecified: Secondary | ICD-10-CM | POA: Diagnosis not present

## 2017-04-07 DIAGNOSIS — Z87891 Personal history of nicotine dependence: Secondary | ICD-10-CM | POA: Diagnosis not present

## 2017-04-07 DIAGNOSIS — R5383 Other fatigue: Secondary | ICD-10-CM | POA: Diagnosis not present

## 2017-04-08 DIAGNOSIS — J449 Chronic obstructive pulmonary disease, unspecified: Secondary | ICD-10-CM | POA: Diagnosis not present

## 2017-04-08 DIAGNOSIS — J9601 Acute respiratory failure with hypoxia: Secondary | ICD-10-CM | POA: Diagnosis not present

## 2017-04-08 DIAGNOSIS — J441 Chronic obstructive pulmonary disease with (acute) exacerbation: Secondary | ICD-10-CM | POA: Diagnosis not present

## 2017-04-08 DIAGNOSIS — J180 Bronchopneumonia, unspecified organism: Secondary | ICD-10-CM | POA: Diagnosis not present

## 2017-04-12 DIAGNOSIS — J441 Chronic obstructive pulmonary disease with (acute) exacerbation: Secondary | ICD-10-CM | POA: Diagnosis not present

## 2017-04-12 DIAGNOSIS — E860 Dehydration: Secondary | ICD-10-CM | POA: Diagnosis not present

## 2017-04-12 DIAGNOSIS — J9601 Acute respiratory failure with hypoxia: Secondary | ICD-10-CM | POA: Diagnosis not present

## 2017-04-12 DIAGNOSIS — J180 Bronchopneumonia, unspecified organism: Secondary | ICD-10-CM | POA: Diagnosis not present

## 2017-04-13 DIAGNOSIS — F1120 Opioid dependence, uncomplicated: Secondary | ICD-10-CM | POA: Diagnosis not present

## 2017-04-13 DIAGNOSIS — G894 Chronic pain syndrome: Secondary | ICD-10-CM | POA: Diagnosis not present

## 2017-04-13 DIAGNOSIS — Z79891 Long term (current) use of opiate analgesic: Secondary | ICD-10-CM | POA: Diagnosis not present

## 2017-04-13 DIAGNOSIS — S32008A Other fracture of unspecified lumbar vertebra, initial encounter for closed fracture: Secondary | ICD-10-CM | POA: Diagnosis not present

## 2017-04-13 DIAGNOSIS — Q761 Klippel-Feil syndrome: Secondary | ICD-10-CM | POA: Diagnosis not present

## 2017-04-13 DIAGNOSIS — M961 Postlaminectomy syndrome, not elsewhere classified: Secondary | ICD-10-CM | POA: Diagnosis not present

## 2017-04-13 DIAGNOSIS — J449 Chronic obstructive pulmonary disease, unspecified: Secondary | ICD-10-CM | POA: Diagnosis not present

## 2017-04-15 DIAGNOSIS — Z7189 Other specified counseling: Secondary | ICD-10-CM | POA: Diagnosis not present

## 2017-04-15 DIAGNOSIS — Z1389 Encounter for screening for other disorder: Secondary | ICD-10-CM | POA: Diagnosis not present

## 2017-04-15 DIAGNOSIS — D649 Anemia, unspecified: Secondary | ICD-10-CM | POA: Diagnosis not present

## 2017-04-15 DIAGNOSIS — Z139 Encounter for screening, unspecified: Secondary | ICD-10-CM | POA: Diagnosis not present

## 2017-04-15 DIAGNOSIS — J449 Chronic obstructive pulmonary disease, unspecified: Secondary | ICD-10-CM | POA: Diagnosis not present

## 2017-04-15 DIAGNOSIS — Z0000 Encounter for general adult medical examination without abnormal findings: Secondary | ICD-10-CM | POA: Diagnosis not present

## 2017-04-15 DIAGNOSIS — Z23 Encounter for immunization: Secondary | ICD-10-CM | POA: Diagnosis not present

## 2017-04-19 ENCOUNTER — Other Ambulatory Visit: Payer: Self-pay | Admitting: Pharmacist

## 2017-04-19 NOTE — Patient Outreach (Signed)
Incoming call from [place] in response to the [facility] Medication Adherence Campaign. Speak with patient. HIPAA identifiers verified and verbal consent received.  Ms. Kennington reports that she is no longer taking a cholesterol medication. Reports that her PCP stopped this medication when her cholesterol numbers improved. Patient denies any medication questions or concerns at this time.  Harlow Asa, PharmD, [facility] [phone]

## 2017-04-20 DIAGNOSIS — Z23 Encounter for immunization: Secondary | ICD-10-CM | POA: Diagnosis not present

## 2017-04-21 DIAGNOSIS — G4733 Obstructive sleep apnea (adult) (pediatric): Secondary | ICD-10-CM | POA: Diagnosis not present

## 2017-04-27 DIAGNOSIS — D509 Iron deficiency anemia, unspecified: Secondary | ICD-10-CM | POA: Diagnosis not present

## 2017-04-27 DIAGNOSIS — D631 Anemia in chronic kidney disease: Secondary | ICD-10-CM | POA: Diagnosis not present

## 2017-04-27 DIAGNOSIS — D649 Anemia, unspecified: Secondary | ICD-10-CM | POA: Diagnosis not present

## 2017-04-27 DIAGNOSIS — D518 Other vitamin B12 deficiency anemias: Secondary | ICD-10-CM | POA: Diagnosis not present

## 2017-04-30 DIAGNOSIS — N183 Chronic kidney disease, stage 3 (moderate): Secondary | ICD-10-CM | POA: Diagnosis not present

## 2017-04-30 DIAGNOSIS — D631 Anemia in chronic kidney disease: Secondary | ICD-10-CM | POA: Diagnosis not present

## 2017-05-05 DIAGNOSIS — J441 Chronic obstructive pulmonary disease with (acute) exacerbation: Secondary | ICD-10-CM | POA: Diagnosis not present

## 2017-05-05 DIAGNOSIS — G4733 Obstructive sleep apnea (adult) (pediatric): Secondary | ICD-10-CM | POA: Diagnosis not present

## 2017-05-07 DIAGNOSIS — I10 Essential (primary) hypertension: Secondary | ICD-10-CM | POA: Diagnosis not present

## 2017-05-07 DIAGNOSIS — N183 Chronic kidney disease, stage 3 (moderate): Secondary | ICD-10-CM | POA: Diagnosis not present

## 2017-05-07 DIAGNOSIS — E559 Vitamin D deficiency, unspecified: Secondary | ICD-10-CM | POA: Diagnosis not present

## 2017-05-09 DIAGNOSIS — J441 Chronic obstructive pulmonary disease with (acute) exacerbation: Secondary | ICD-10-CM | POA: Diagnosis not present

## 2017-05-09 DIAGNOSIS — J180 Bronchopneumonia, unspecified organism: Secondary | ICD-10-CM | POA: Diagnosis not present

## 2017-05-09 DIAGNOSIS — J9601 Acute respiratory failure with hypoxia: Secondary | ICD-10-CM | POA: Diagnosis not present

## 2017-05-09 DIAGNOSIS — J449 Chronic obstructive pulmonary disease, unspecified: Secondary | ICD-10-CM | POA: Diagnosis not present

## 2017-05-11 DIAGNOSIS — S32008A Other fracture of unspecified lumbar vertebra, initial encounter for closed fracture: Secondary | ICD-10-CM | POA: Diagnosis not present

## 2017-05-11 DIAGNOSIS — M961 Postlaminectomy syndrome, not elsewhere classified: Secondary | ICD-10-CM | POA: Diagnosis not present

## 2017-05-11 DIAGNOSIS — Q761 Klippel-Feil syndrome: Secondary | ICD-10-CM | POA: Diagnosis not present

## 2017-05-11 DIAGNOSIS — G894 Chronic pain syndrome: Secondary | ICD-10-CM | POA: Diagnosis not present

## 2017-05-11 DIAGNOSIS — Z79891 Long term (current) use of opiate analgesic: Secondary | ICD-10-CM | POA: Diagnosis not present

## 2017-05-11 DIAGNOSIS — J449 Chronic obstructive pulmonary disease, unspecified: Secondary | ICD-10-CM | POA: Diagnosis not present

## 2017-05-11 DIAGNOSIS — F1120 Opioid dependence, uncomplicated: Secondary | ICD-10-CM | POA: Diagnosis not present

## 2017-05-12 DIAGNOSIS — J180 Bronchopneumonia, unspecified organism: Secondary | ICD-10-CM | POA: Diagnosis not present

## 2017-05-12 DIAGNOSIS — E860 Dehydration: Secondary | ICD-10-CM | POA: Diagnosis not present

## 2017-05-12 DIAGNOSIS — J9601 Acute respiratory failure with hypoxia: Secondary | ICD-10-CM | POA: Diagnosis not present

## 2017-05-12 DIAGNOSIS — J441 Chronic obstructive pulmonary disease with (acute) exacerbation: Secondary | ICD-10-CM | POA: Diagnosis not present

## 2017-05-14 DIAGNOSIS — D631 Anemia in chronic kidney disease: Secondary | ICD-10-CM | POA: Diagnosis not present

## 2017-05-14 DIAGNOSIS — N183 Chronic kidney disease, stage 3 (moderate): Secondary | ICD-10-CM | POA: Diagnosis not present

## 2017-05-21 DIAGNOSIS — D631 Anemia in chronic kidney disease: Secondary | ICD-10-CM | POA: Diagnosis not present

## 2017-05-21 DIAGNOSIS — N183 Chronic kidney disease, stage 3 (moderate): Secondary | ICD-10-CM | POA: Diagnosis not present

## 2017-05-21 DIAGNOSIS — G4733 Obstructive sleep apnea (adult) (pediatric): Secondary | ICD-10-CM | POA: Diagnosis not present

## 2017-05-25 DIAGNOSIS — R109 Unspecified abdominal pain: Secondary | ICD-10-CM | POA: Diagnosis not present

## 2017-05-25 DIAGNOSIS — R1011 Right upper quadrant pain: Secondary | ICD-10-CM | POA: Diagnosis not present

## 2017-05-25 DIAGNOSIS — Z6838 Body mass index (BMI) 38.0-38.9, adult: Secondary | ICD-10-CM | POA: Diagnosis not present

## 2017-05-25 DIAGNOSIS — Z7189 Other specified counseling: Secondary | ICD-10-CM | POA: Diagnosis not present

## 2017-05-28 DIAGNOSIS — N183 Chronic kidney disease, stage 3 (moderate): Secondary | ICD-10-CM | POA: Diagnosis not present

## 2017-05-28 DIAGNOSIS — D631 Anemia in chronic kidney disease: Secondary | ICD-10-CM | POA: Diagnosis not present

## 2017-06-01 DIAGNOSIS — Z6838 Body mass index (BMI) 38.0-38.9, adult: Secondary | ICD-10-CM | POA: Diagnosis not present

## 2017-06-01 DIAGNOSIS — R109 Unspecified abdominal pain: Secondary | ICD-10-CM | POA: Diagnosis not present

## 2017-06-01 DIAGNOSIS — J3089 Other allergic rhinitis: Secondary | ICD-10-CM | POA: Diagnosis not present

## 2017-06-07 DIAGNOSIS — D125 Benign neoplasm of sigmoid colon: Secondary | ICD-10-CM | POA: Diagnosis not present

## 2017-06-07 DIAGNOSIS — Z86010 Personal history of colonic polyps: Secondary | ICD-10-CM | POA: Diagnosis not present

## 2017-06-07 DIAGNOSIS — D126 Benign neoplasm of colon, unspecified: Secondary | ICD-10-CM | POA: Diagnosis not present

## 2017-06-07 DIAGNOSIS — D124 Benign neoplasm of descending colon: Secondary | ICD-10-CM | POA: Diagnosis not present

## 2017-06-07 DIAGNOSIS — D123 Benign neoplasm of transverse colon: Secondary | ICD-10-CM | POA: Diagnosis not present

## 2017-06-08 DIAGNOSIS — J449 Chronic obstructive pulmonary disease, unspecified: Secondary | ICD-10-CM | POA: Diagnosis not present

## 2017-06-08 DIAGNOSIS — J9601 Acute respiratory failure with hypoxia: Secondary | ICD-10-CM | POA: Diagnosis not present

## 2017-06-08 DIAGNOSIS — J180 Bronchopneumonia, unspecified organism: Secondary | ICD-10-CM | POA: Diagnosis not present

## 2017-06-08 DIAGNOSIS — J441 Chronic obstructive pulmonary disease with (acute) exacerbation: Secondary | ICD-10-CM | POA: Diagnosis not present

## 2017-06-10 DIAGNOSIS — M961 Postlaminectomy syndrome, not elsewhere classified: Secondary | ICD-10-CM | POA: Diagnosis not present

## 2017-06-10 DIAGNOSIS — F1120 Opioid dependence, uncomplicated: Secondary | ICD-10-CM | POA: Diagnosis not present

## 2017-06-10 DIAGNOSIS — G894 Chronic pain syndrome: Secondary | ICD-10-CM | POA: Diagnosis not present

## 2017-06-10 DIAGNOSIS — Z79891 Long term (current) use of opiate analgesic: Secondary | ICD-10-CM | POA: Diagnosis not present

## 2017-06-10 DIAGNOSIS — Q761 Klippel-Feil syndrome: Secondary | ICD-10-CM | POA: Diagnosis not present

## 2017-06-10 DIAGNOSIS — S32008A Other fracture of unspecified lumbar vertebra, initial encounter for closed fracture: Secondary | ICD-10-CM | POA: Diagnosis not present

## 2017-06-10 DIAGNOSIS — J449 Chronic obstructive pulmonary disease, unspecified: Secondary | ICD-10-CM | POA: Diagnosis not present

## 2017-06-12 DIAGNOSIS — E860 Dehydration: Secondary | ICD-10-CM | POA: Diagnosis not present

## 2017-06-12 DIAGNOSIS — J9601 Acute respiratory failure with hypoxia: Secondary | ICD-10-CM | POA: Diagnosis not present

## 2017-06-12 DIAGNOSIS — J180 Bronchopneumonia, unspecified organism: Secondary | ICD-10-CM | POA: Diagnosis not present

## 2017-06-12 DIAGNOSIS — J441 Chronic obstructive pulmonary disease with (acute) exacerbation: Secondary | ICD-10-CM | POA: Diagnosis not present

## 2017-06-16 DIAGNOSIS — R5383 Other fatigue: Secondary | ICD-10-CM | POA: Diagnosis not present

## 2017-06-16 DIAGNOSIS — G4733 Obstructive sleep apnea (adult) (pediatric): Secondary | ICD-10-CM | POA: Diagnosis not present

## 2017-06-16 DIAGNOSIS — J449 Chronic obstructive pulmonary disease, unspecified: Secondary | ICD-10-CM | POA: Diagnosis not present

## 2017-06-21 DIAGNOSIS — G4733 Obstructive sleep apnea (adult) (pediatric): Secondary | ICD-10-CM | POA: Diagnosis not present

## 2017-07-06 DIAGNOSIS — D649 Anemia, unspecified: Secondary | ICD-10-CM | POA: Diagnosis not present

## 2017-07-06 DIAGNOSIS — D518 Other vitamin B12 deficiency anemias: Secondary | ICD-10-CM | POA: Diagnosis not present

## 2017-07-06 DIAGNOSIS — D631 Anemia in chronic kidney disease: Secondary | ICD-10-CM | POA: Diagnosis not present

## 2017-07-09 DIAGNOSIS — J9601 Acute respiratory failure with hypoxia: Secondary | ICD-10-CM | POA: Diagnosis not present

## 2017-07-09 DIAGNOSIS — N390 Urinary tract infection, site not specified: Secondary | ICD-10-CM | POA: Diagnosis not present

## 2017-07-09 DIAGNOSIS — E559 Vitamin D deficiency, unspecified: Secondary | ICD-10-CM | POA: Diagnosis not present

## 2017-07-09 DIAGNOSIS — N183 Chronic kidney disease, stage 3 (moderate): Secondary | ICD-10-CM | POA: Diagnosis not present

## 2017-07-09 DIAGNOSIS — J441 Chronic obstructive pulmonary disease with (acute) exacerbation: Secondary | ICD-10-CM | POA: Diagnosis not present

## 2017-07-09 DIAGNOSIS — I10 Essential (primary) hypertension: Secondary | ICD-10-CM | POA: Diagnosis not present

## 2017-07-09 DIAGNOSIS — J449 Chronic obstructive pulmonary disease, unspecified: Secondary | ICD-10-CM | POA: Diagnosis not present

## 2017-07-09 DIAGNOSIS — J180 Bronchopneumonia, unspecified organism: Secondary | ICD-10-CM | POA: Diagnosis not present

## 2017-07-12 DIAGNOSIS — J180 Bronchopneumonia, unspecified organism: Secondary | ICD-10-CM | POA: Diagnosis not present

## 2017-07-12 DIAGNOSIS — Z79891 Long term (current) use of opiate analgesic: Secondary | ICD-10-CM | POA: Diagnosis not present

## 2017-07-12 DIAGNOSIS — G894 Chronic pain syndrome: Secondary | ICD-10-CM | POA: Diagnosis not present

## 2017-07-12 DIAGNOSIS — J9601 Acute respiratory failure with hypoxia: Secondary | ICD-10-CM | POA: Diagnosis not present

## 2017-07-12 DIAGNOSIS — J441 Chronic obstructive pulmonary disease with (acute) exacerbation: Secondary | ICD-10-CM | POA: Diagnosis not present

## 2017-07-12 DIAGNOSIS — Q761 Klippel-Feil syndrome: Secondary | ICD-10-CM | POA: Diagnosis not present

## 2017-07-12 DIAGNOSIS — S32008A Other fracture of unspecified lumbar vertebra, initial encounter for closed fracture: Secondary | ICD-10-CM | POA: Diagnosis not present

## 2017-07-12 DIAGNOSIS — M961 Postlaminectomy syndrome, not elsewhere classified: Secondary | ICD-10-CM | POA: Diagnosis not present

## 2017-07-12 DIAGNOSIS — F1120 Opioid dependence, uncomplicated: Secondary | ICD-10-CM | POA: Diagnosis not present

## 2017-07-12 DIAGNOSIS — J449 Chronic obstructive pulmonary disease, unspecified: Secondary | ICD-10-CM | POA: Diagnosis not present

## 2017-07-12 DIAGNOSIS — E860 Dehydration: Secondary | ICD-10-CM | POA: Diagnosis not present

## 2017-07-15 DIAGNOSIS — J449 Chronic obstructive pulmonary disease, unspecified: Secondary | ICD-10-CM | POA: Diagnosis not present

## 2017-07-15 DIAGNOSIS — I119 Hypertensive heart disease without heart failure: Secondary | ICD-10-CM | POA: Diagnosis not present

## 2017-07-15 DIAGNOSIS — N183 Chronic kidney disease, stage 3 (moderate): Secondary | ICD-10-CM | POA: Diagnosis not present

## 2017-07-15 DIAGNOSIS — J9610 Chronic respiratory failure, unspecified whether with hypoxia or hypercapnia: Secondary | ICD-10-CM | POA: Diagnosis not present

## 2017-07-21 DIAGNOSIS — G4733 Obstructive sleep apnea (adult) (pediatric): Secondary | ICD-10-CM | POA: Diagnosis not present

## 2017-08-08 DIAGNOSIS — J180 Bronchopneumonia, unspecified organism: Secondary | ICD-10-CM | POA: Diagnosis not present

## 2017-08-08 DIAGNOSIS — J441 Chronic obstructive pulmonary disease with (acute) exacerbation: Secondary | ICD-10-CM | POA: Diagnosis not present

## 2017-08-08 DIAGNOSIS — J9601 Acute respiratory failure with hypoxia: Secondary | ICD-10-CM | POA: Diagnosis not present

## 2017-08-08 DIAGNOSIS — J449 Chronic obstructive pulmonary disease, unspecified: Secondary | ICD-10-CM | POA: Diagnosis not present

## 2017-08-12 DIAGNOSIS — M961 Postlaminectomy syndrome, not elsewhere classified: Secondary | ICD-10-CM | POA: Diagnosis not present

## 2017-08-12 DIAGNOSIS — J209 Acute bronchitis, unspecified: Secondary | ICD-10-CM | POA: Diagnosis not present

## 2017-08-12 DIAGNOSIS — J449 Chronic obstructive pulmonary disease, unspecified: Secondary | ICD-10-CM | POA: Diagnosis not present

## 2017-08-12 DIAGNOSIS — J441 Chronic obstructive pulmonary disease with (acute) exacerbation: Secondary | ICD-10-CM | POA: Diagnosis not present

## 2017-08-12 DIAGNOSIS — G894 Chronic pain syndrome: Secondary | ICD-10-CM | POA: Diagnosis not present

## 2017-08-12 DIAGNOSIS — E860 Dehydration: Secondary | ICD-10-CM | POA: Diagnosis not present

## 2017-08-12 DIAGNOSIS — J9601 Acute respiratory failure with hypoxia: Secondary | ICD-10-CM | POA: Diagnosis not present

## 2017-08-12 DIAGNOSIS — F1120 Opioid dependence, uncomplicated: Secondary | ICD-10-CM | POA: Diagnosis not present

## 2017-08-12 DIAGNOSIS — R05 Cough: Secondary | ICD-10-CM | POA: Diagnosis not present

## 2017-08-12 DIAGNOSIS — Z6839 Body mass index (BMI) 39.0-39.9, adult: Secondary | ICD-10-CM | POA: Diagnosis not present

## 2017-08-12 DIAGNOSIS — Z79891 Long term (current) use of opiate analgesic: Secondary | ICD-10-CM | POA: Diagnosis not present

## 2017-08-12 DIAGNOSIS — S32008A Other fracture of unspecified lumbar vertebra, initial encounter for closed fracture: Secondary | ICD-10-CM | POA: Diagnosis not present

## 2017-08-12 DIAGNOSIS — Q761 Klippel-Feil syndrome: Secondary | ICD-10-CM | POA: Diagnosis not present

## 2017-08-12 DIAGNOSIS — J180 Bronchopneumonia, unspecified organism: Secondary | ICD-10-CM | POA: Diagnosis not present

## 2017-08-21 DIAGNOSIS — G4733 Obstructive sleep apnea (adult) (pediatric): Secondary | ICD-10-CM | POA: Diagnosis not present

## 2017-09-01 DIAGNOSIS — Z1231 Encounter for screening mammogram for malignant neoplasm of breast: Secondary | ICD-10-CM | POA: Diagnosis not present

## 2017-09-03 DIAGNOSIS — G4733 Obstructive sleep apnea (adult) (pediatric): Secondary | ICD-10-CM | POA: Diagnosis not present

## 2017-09-07 DIAGNOSIS — G4733 Obstructive sleep apnea (adult) (pediatric): Secondary | ICD-10-CM | POA: Diagnosis not present

## 2017-09-07 DIAGNOSIS — J449 Chronic obstructive pulmonary disease, unspecified: Secondary | ICD-10-CM | POA: Diagnosis not present

## 2017-09-08 DIAGNOSIS — J9601 Acute respiratory failure with hypoxia: Secondary | ICD-10-CM | POA: Diagnosis not present

## 2017-09-08 DIAGNOSIS — J449 Chronic obstructive pulmonary disease, unspecified: Secondary | ICD-10-CM | POA: Diagnosis not present

## 2017-09-08 DIAGNOSIS — J180 Bronchopneumonia, unspecified organism: Secondary | ICD-10-CM | POA: Diagnosis not present

## 2017-09-08 DIAGNOSIS — J441 Chronic obstructive pulmonary disease with (acute) exacerbation: Secondary | ICD-10-CM | POA: Diagnosis not present

## 2017-09-09 DIAGNOSIS — F1120 Opioid dependence, uncomplicated: Secondary | ICD-10-CM | POA: Diagnosis not present

## 2017-09-09 DIAGNOSIS — G894 Chronic pain syndrome: Secondary | ICD-10-CM | POA: Diagnosis not present

## 2017-09-09 DIAGNOSIS — Z79891 Long term (current) use of opiate analgesic: Secondary | ICD-10-CM | POA: Diagnosis not present

## 2017-09-09 DIAGNOSIS — S32008A Other fracture of unspecified lumbar vertebra, initial encounter for closed fracture: Secondary | ICD-10-CM | POA: Diagnosis not present

## 2017-09-09 DIAGNOSIS — J449 Chronic obstructive pulmonary disease, unspecified: Secondary | ICD-10-CM | POA: Diagnosis not present

## 2017-09-09 DIAGNOSIS — M961 Postlaminectomy syndrome, not elsewhere classified: Secondary | ICD-10-CM | POA: Diagnosis not present

## 2017-09-09 DIAGNOSIS — Q761 Klippel-Feil syndrome: Secondary | ICD-10-CM | POA: Diagnosis not present

## 2017-09-12 DIAGNOSIS — J441 Chronic obstructive pulmonary disease with (acute) exacerbation: Secondary | ICD-10-CM | POA: Diagnosis not present

## 2017-09-12 DIAGNOSIS — J180 Bronchopneumonia, unspecified organism: Secondary | ICD-10-CM | POA: Diagnosis not present

## 2017-09-12 DIAGNOSIS — J9601 Acute respiratory failure with hypoxia: Secondary | ICD-10-CM | POA: Diagnosis not present

## 2017-09-12 DIAGNOSIS — E860 Dehydration: Secondary | ICD-10-CM | POA: Diagnosis not present

## 2017-09-21 DIAGNOSIS — G4733 Obstructive sleep apnea (adult) (pediatric): Secondary | ICD-10-CM | POA: Diagnosis not present

## 2017-09-30 DIAGNOSIS — Z6841 Body Mass Index (BMI) 40.0 and over, adult: Secondary | ICD-10-CM | POA: Diagnosis not present

## 2017-09-30 DIAGNOSIS — H5789 Other specified disorders of eye and adnexa: Secondary | ICD-10-CM | POA: Diagnosis not present

## 2017-09-30 DIAGNOSIS — Z7189 Other specified counseling: Secondary | ICD-10-CM | POA: Diagnosis not present

## 2017-09-30 DIAGNOSIS — D649 Anemia, unspecified: Secondary | ICD-10-CM | POA: Diagnosis not present

## 2017-10-05 DIAGNOSIS — M961 Postlaminectomy syndrome, not elsewhere classified: Secondary | ICD-10-CM | POA: Diagnosis not present

## 2017-10-05 DIAGNOSIS — M461 Sacroiliitis, not elsewhere classified: Secondary | ICD-10-CM | POA: Diagnosis not present

## 2017-10-05 DIAGNOSIS — G894 Chronic pain syndrome: Secondary | ICD-10-CM | POA: Diagnosis not present

## 2017-10-05 DIAGNOSIS — Q761 Klippel-Feil syndrome: Secondary | ICD-10-CM | POA: Diagnosis not present

## 2017-10-05 DIAGNOSIS — J449 Chronic obstructive pulmonary disease, unspecified: Secondary | ICD-10-CM | POA: Diagnosis not present

## 2017-10-05 DIAGNOSIS — M7061 Trochanteric bursitis, right hip: Secondary | ICD-10-CM | POA: Diagnosis not present

## 2017-10-05 DIAGNOSIS — F1120 Opioid dependence, uncomplicated: Secondary | ICD-10-CM | POA: Diagnosis not present

## 2017-10-05 DIAGNOSIS — S32008A Other fracture of unspecified lumbar vertebra, initial encounter for closed fracture: Secondary | ICD-10-CM | POA: Diagnosis not present

## 2017-10-05 DIAGNOSIS — Z79891 Long term (current) use of opiate analgesic: Secondary | ICD-10-CM | POA: Diagnosis not present

## 2017-10-07 DIAGNOSIS — J441 Chronic obstructive pulmonary disease with (acute) exacerbation: Secondary | ICD-10-CM | POA: Diagnosis not present

## 2017-10-07 DIAGNOSIS — J449 Chronic obstructive pulmonary disease, unspecified: Secondary | ICD-10-CM | POA: Diagnosis not present

## 2017-10-07 DIAGNOSIS — J180 Bronchopneumonia, unspecified organism: Secondary | ICD-10-CM | POA: Diagnosis not present

## 2017-10-07 DIAGNOSIS — J9601 Acute respiratory failure with hypoxia: Secondary | ICD-10-CM | POA: Diagnosis not present

## 2017-10-10 DIAGNOSIS — J180 Bronchopneumonia, unspecified organism: Secondary | ICD-10-CM | POA: Diagnosis not present

## 2017-10-10 DIAGNOSIS — J9601 Acute respiratory failure with hypoxia: Secondary | ICD-10-CM | POA: Diagnosis not present

## 2017-10-10 DIAGNOSIS — J441 Chronic obstructive pulmonary disease with (acute) exacerbation: Secondary | ICD-10-CM | POA: Diagnosis not present

## 2017-10-10 DIAGNOSIS — E860 Dehydration: Secondary | ICD-10-CM | POA: Diagnosis not present

## 2017-10-14 DIAGNOSIS — D631 Anemia in chronic kidney disease: Secondary | ICD-10-CM | POA: Diagnosis not present

## 2017-10-14 DIAGNOSIS — D509 Iron deficiency anemia, unspecified: Secondary | ICD-10-CM | POA: Diagnosis not present

## 2017-10-14 DIAGNOSIS — D518 Other vitamin B12 deficiency anemias: Secondary | ICD-10-CM | POA: Diagnosis not present

## 2017-10-14 DIAGNOSIS — D649 Anemia, unspecified: Secondary | ICD-10-CM | POA: Diagnosis not present

## 2017-10-19 DIAGNOSIS — G4733 Obstructive sleep apnea (adult) (pediatric): Secondary | ICD-10-CM | POA: Diagnosis not present

## 2017-10-21 DIAGNOSIS — M7061 Trochanteric bursitis, right hip: Secondary | ICD-10-CM | POA: Diagnosis not present

## 2017-10-21 DIAGNOSIS — Z79891 Long term (current) use of opiate analgesic: Secondary | ICD-10-CM | POA: Diagnosis not present

## 2017-10-21 DIAGNOSIS — J449 Chronic obstructive pulmonary disease, unspecified: Secondary | ICD-10-CM | POA: Diagnosis not present

## 2017-10-21 DIAGNOSIS — M961 Postlaminectomy syndrome, not elsewhere classified: Secondary | ICD-10-CM | POA: Diagnosis not present

## 2017-10-21 DIAGNOSIS — S32008A Other fracture of unspecified lumbar vertebra, initial encounter for closed fracture: Secondary | ICD-10-CM | POA: Diagnosis not present

## 2017-10-21 DIAGNOSIS — Q761 Klippel-Feil syndrome: Secondary | ICD-10-CM | POA: Diagnosis not present

## 2017-10-21 DIAGNOSIS — G894 Chronic pain syndrome: Secondary | ICD-10-CM | POA: Diagnosis not present

## 2017-10-21 DIAGNOSIS — M461 Sacroiliitis, not elsewhere classified: Secondary | ICD-10-CM | POA: Diagnosis not present

## 2017-10-21 DIAGNOSIS — F1120 Opioid dependence, uncomplicated: Secondary | ICD-10-CM | POA: Diagnosis not present

## 2017-10-26 DIAGNOSIS — N183 Chronic kidney disease, stage 3 (moderate): Secondary | ICD-10-CM | POA: Diagnosis not present

## 2017-10-26 DIAGNOSIS — D631 Anemia in chronic kidney disease: Secondary | ICD-10-CM | POA: Diagnosis not present

## 2017-10-27 DIAGNOSIS — N183 Chronic kidney disease, stage 3 (moderate): Secondary | ICD-10-CM | POA: Diagnosis not present

## 2017-10-27 DIAGNOSIS — E559 Vitamin D deficiency, unspecified: Secondary | ICD-10-CM | POA: Diagnosis not present

## 2017-10-27 DIAGNOSIS — N390 Urinary tract infection, site not specified: Secondary | ICD-10-CM | POA: Diagnosis not present

## 2017-10-27 DIAGNOSIS — I10 Essential (primary) hypertension: Secondary | ICD-10-CM | POA: Diagnosis not present

## 2017-11-02 DIAGNOSIS — M7061 Trochanteric bursitis, right hip: Secondary | ICD-10-CM | POA: Diagnosis not present

## 2017-11-02 DIAGNOSIS — S32008A Other fracture of unspecified lumbar vertebra, initial encounter for closed fracture: Secondary | ICD-10-CM | POA: Diagnosis not present

## 2017-11-02 DIAGNOSIS — G4733 Obstructive sleep apnea (adult) (pediatric): Secondary | ICD-10-CM | POA: Diagnosis not present

## 2017-11-02 DIAGNOSIS — Q761 Klippel-Feil syndrome: Secondary | ICD-10-CM | POA: Diagnosis not present

## 2017-11-02 DIAGNOSIS — M961 Postlaminectomy syndrome, not elsewhere classified: Secondary | ICD-10-CM | POA: Diagnosis not present

## 2017-11-02 DIAGNOSIS — M461 Sacroiliitis, not elsewhere classified: Secondary | ICD-10-CM | POA: Diagnosis not present

## 2017-11-02 DIAGNOSIS — N183 Chronic kidney disease, stage 3 (moderate): Secondary | ICD-10-CM | POA: Diagnosis not present

## 2017-11-02 DIAGNOSIS — I10 Essential (primary) hypertension: Secondary | ICD-10-CM | POA: Diagnosis not present

## 2017-11-02 DIAGNOSIS — E559 Vitamin D deficiency, unspecified: Secondary | ICD-10-CM | POA: Diagnosis not present

## 2017-11-02 DIAGNOSIS — Z79891 Long term (current) use of opiate analgesic: Secondary | ICD-10-CM | POA: Diagnosis not present

## 2017-11-02 DIAGNOSIS — G894 Chronic pain syndrome: Secondary | ICD-10-CM | POA: Diagnosis not present

## 2017-11-02 DIAGNOSIS — J449 Chronic obstructive pulmonary disease, unspecified: Secondary | ICD-10-CM | POA: Diagnosis not present

## 2017-11-02 DIAGNOSIS — F1120 Opioid dependence, uncomplicated: Secondary | ICD-10-CM | POA: Diagnosis not present

## 2017-11-06 DIAGNOSIS — J9601 Acute respiratory failure with hypoxia: Secondary | ICD-10-CM | POA: Diagnosis not present

## 2017-11-06 DIAGNOSIS — J449 Chronic obstructive pulmonary disease, unspecified: Secondary | ICD-10-CM | POA: Diagnosis not present

## 2017-11-06 DIAGNOSIS — J441 Chronic obstructive pulmonary disease with (acute) exacerbation: Secondary | ICD-10-CM | POA: Diagnosis not present

## 2017-11-06 DIAGNOSIS — J180 Bronchopneumonia, unspecified organism: Secondary | ICD-10-CM | POA: Diagnosis not present

## 2017-11-09 DIAGNOSIS — N183 Chronic kidney disease, stage 3 (moderate): Secondary | ICD-10-CM | POA: Diagnosis not present

## 2017-11-09 DIAGNOSIS — D631 Anemia in chronic kidney disease: Secondary | ICD-10-CM | POA: Diagnosis not present

## 2017-11-10 DIAGNOSIS — M961 Postlaminectomy syndrome, not elsewhere classified: Secondary | ICD-10-CM | POA: Diagnosis not present

## 2017-11-10 DIAGNOSIS — J449 Chronic obstructive pulmonary disease, unspecified: Secondary | ICD-10-CM | POA: Diagnosis not present

## 2017-11-10 DIAGNOSIS — G894 Chronic pain syndrome: Secondary | ICD-10-CM | POA: Diagnosis not present

## 2017-11-10 DIAGNOSIS — S32008A Other fracture of unspecified lumbar vertebra, initial encounter for closed fracture: Secondary | ICD-10-CM | POA: Diagnosis not present

## 2017-11-10 DIAGNOSIS — E860 Dehydration: Secondary | ICD-10-CM | POA: Diagnosis not present

## 2017-11-10 DIAGNOSIS — Q761 Klippel-Feil syndrome: Secondary | ICD-10-CM | POA: Diagnosis not present

## 2017-11-10 DIAGNOSIS — J9601 Acute respiratory failure with hypoxia: Secondary | ICD-10-CM | POA: Diagnosis not present

## 2017-11-10 DIAGNOSIS — J441 Chronic obstructive pulmonary disease with (acute) exacerbation: Secondary | ICD-10-CM | POA: Diagnosis not present

## 2017-11-10 DIAGNOSIS — Z79891 Long term (current) use of opiate analgesic: Secondary | ICD-10-CM | POA: Diagnosis not present

## 2017-11-10 DIAGNOSIS — J180 Bronchopneumonia, unspecified organism: Secondary | ICD-10-CM | POA: Diagnosis not present

## 2017-11-10 DIAGNOSIS — M7061 Trochanteric bursitis, right hip: Secondary | ICD-10-CM | POA: Diagnosis not present

## 2017-11-10 DIAGNOSIS — M461 Sacroiliitis, not elsewhere classified: Secondary | ICD-10-CM | POA: Diagnosis not present

## 2017-11-10 DIAGNOSIS — F1120 Opioid dependence, uncomplicated: Secondary | ICD-10-CM | POA: Diagnosis not present

## 2017-11-16 DIAGNOSIS — D631 Anemia in chronic kidney disease: Secondary | ICD-10-CM | POA: Diagnosis not present

## 2017-11-16 DIAGNOSIS — N183 Chronic kidney disease, stage 3 (moderate): Secondary | ICD-10-CM | POA: Diagnosis not present

## 2017-11-19 DIAGNOSIS — G4733 Obstructive sleep apnea (adult) (pediatric): Secondary | ICD-10-CM | POA: Diagnosis not present

## 2017-11-22 DIAGNOSIS — N183 Chronic kidney disease, stage 3 (moderate): Secondary | ICD-10-CM | POA: Diagnosis not present

## 2017-11-23 DIAGNOSIS — D631 Anemia in chronic kidney disease: Secondary | ICD-10-CM | POA: Diagnosis not present

## 2017-11-23 DIAGNOSIS — N183 Chronic kidney disease, stage 3 (moderate): Secondary | ICD-10-CM | POA: Diagnosis not present

## 2017-11-25 DIAGNOSIS — N390 Urinary tract infection, site not specified: Secondary | ICD-10-CM | POA: Diagnosis not present

## 2017-11-25 DIAGNOSIS — R809 Proteinuria, unspecified: Secondary | ICD-10-CM | POA: Diagnosis not present

## 2017-11-25 DIAGNOSIS — I10 Essential (primary) hypertension: Secondary | ICD-10-CM | POA: Diagnosis not present

## 2017-11-25 DIAGNOSIS — N183 Chronic kidney disease, stage 3 (moderate): Secondary | ICD-10-CM | POA: Diagnosis not present

## 2017-11-25 DIAGNOSIS — E559 Vitamin D deficiency, unspecified: Secondary | ICD-10-CM | POA: Diagnosis not present

## 2017-11-30 DIAGNOSIS — G894 Chronic pain syndrome: Secondary | ICD-10-CM | POA: Diagnosis not present

## 2017-11-30 DIAGNOSIS — Q761 Klippel-Feil syndrome: Secondary | ICD-10-CM | POA: Diagnosis not present

## 2017-11-30 DIAGNOSIS — J449 Chronic obstructive pulmonary disease, unspecified: Secondary | ICD-10-CM | POA: Diagnosis not present

## 2017-11-30 DIAGNOSIS — M961 Postlaminectomy syndrome, not elsewhere classified: Secondary | ICD-10-CM | POA: Diagnosis not present

## 2017-11-30 DIAGNOSIS — F1120 Opioid dependence, uncomplicated: Secondary | ICD-10-CM | POA: Diagnosis not present

## 2017-11-30 DIAGNOSIS — Z79891 Long term (current) use of opiate analgesic: Secondary | ICD-10-CM | POA: Diagnosis not present

## 2017-11-30 DIAGNOSIS — S32008A Other fracture of unspecified lumbar vertebra, initial encounter for closed fracture: Secondary | ICD-10-CM | POA: Diagnosis not present

## 2017-11-30 DIAGNOSIS — M461 Sacroiliitis, not elsewhere classified: Secondary | ICD-10-CM | POA: Diagnosis not present

## 2017-11-30 DIAGNOSIS — M7061 Trochanteric bursitis, right hip: Secondary | ICD-10-CM | POA: Diagnosis not present

## 2017-12-07 DIAGNOSIS — J9601 Acute respiratory failure with hypoxia: Secondary | ICD-10-CM | POA: Diagnosis not present

## 2017-12-07 DIAGNOSIS — J449 Chronic obstructive pulmonary disease, unspecified: Secondary | ICD-10-CM | POA: Diagnosis not present

## 2017-12-07 DIAGNOSIS — J441 Chronic obstructive pulmonary disease with (acute) exacerbation: Secondary | ICD-10-CM | POA: Diagnosis not present

## 2017-12-07 DIAGNOSIS — J180 Bronchopneumonia, unspecified organism: Secondary | ICD-10-CM | POA: Diagnosis not present

## 2017-12-10 DIAGNOSIS — J441 Chronic obstructive pulmonary disease with (acute) exacerbation: Secondary | ICD-10-CM | POA: Diagnosis not present

## 2017-12-10 DIAGNOSIS — J180 Bronchopneumonia, unspecified organism: Secondary | ICD-10-CM | POA: Diagnosis not present

## 2017-12-10 DIAGNOSIS — E860 Dehydration: Secondary | ICD-10-CM | POA: Diagnosis not present

## 2017-12-10 DIAGNOSIS — J9601 Acute respiratory failure with hypoxia: Secondary | ICD-10-CM | POA: Diagnosis not present

## 2017-12-19 DIAGNOSIS — G4733 Obstructive sleep apnea (adult) (pediatric): Secondary | ICD-10-CM | POA: Diagnosis not present

## 2017-12-27 DIAGNOSIS — G4733 Obstructive sleep apnea (adult) (pediatric): Secondary | ICD-10-CM | POA: Diagnosis not present

## 2017-12-28 DIAGNOSIS — R0600 Dyspnea, unspecified: Secondary | ICD-10-CM | POA: Diagnosis not present

## 2017-12-28 DIAGNOSIS — G4733 Obstructive sleep apnea (adult) (pediatric): Secondary | ICD-10-CM | POA: Diagnosis not present

## 2017-12-28 DIAGNOSIS — J301 Allergic rhinitis due to pollen: Secondary | ICD-10-CM | POA: Diagnosis not present

## 2017-12-28 DIAGNOSIS — J449 Chronic obstructive pulmonary disease, unspecified: Secondary | ICD-10-CM | POA: Diagnosis not present

## 2017-12-30 DIAGNOSIS — Z79891 Long term (current) use of opiate analgesic: Secondary | ICD-10-CM | POA: Diagnosis not present

## 2017-12-30 DIAGNOSIS — F1120 Opioid dependence, uncomplicated: Secondary | ICD-10-CM | POA: Diagnosis not present

## 2017-12-30 DIAGNOSIS — S32008A Other fracture of unspecified lumbar vertebra, initial encounter for closed fracture: Secondary | ICD-10-CM | POA: Diagnosis not present

## 2017-12-30 DIAGNOSIS — J449 Chronic obstructive pulmonary disease, unspecified: Secondary | ICD-10-CM | POA: Diagnosis not present

## 2017-12-30 DIAGNOSIS — M961 Postlaminectomy syndrome, not elsewhere classified: Secondary | ICD-10-CM | POA: Diagnosis not present

## 2017-12-30 DIAGNOSIS — G894 Chronic pain syndrome: Secondary | ICD-10-CM | POA: Diagnosis not present

## 2017-12-30 DIAGNOSIS — M461 Sacroiliitis, not elsewhere classified: Secondary | ICD-10-CM | POA: Diagnosis not present

## 2017-12-30 DIAGNOSIS — M7061 Trochanteric bursitis, right hip: Secondary | ICD-10-CM | POA: Diagnosis not present

## 2017-12-30 DIAGNOSIS — Q761 Klippel-Feil syndrome: Secondary | ICD-10-CM | POA: Diagnosis not present

## 2017-12-31 DIAGNOSIS — R0600 Dyspnea, unspecified: Secondary | ICD-10-CM | POA: Diagnosis not present

## 2017-12-31 DIAGNOSIS — R0602 Shortness of breath: Secondary | ICD-10-CM | POA: Diagnosis not present

## 2018-01-05 DIAGNOSIS — I10 Essential (primary) hypertension: Secondary | ICD-10-CM | POA: Diagnosis not present

## 2018-01-05 DIAGNOSIS — D510 Vitamin B12 deficiency anemia due to intrinsic factor deficiency: Secondary | ICD-10-CM | POA: Diagnosis not present

## 2018-01-05 DIAGNOSIS — N183 Chronic kidney disease, stage 3 (moderate): Secondary | ICD-10-CM | POA: Diagnosis not present

## 2018-01-05 DIAGNOSIS — D631 Anemia in chronic kidney disease: Secondary | ICD-10-CM | POA: Diagnosis not present

## 2018-01-05 DIAGNOSIS — E611 Iron deficiency: Secondary | ICD-10-CM | POA: Diagnosis not present

## 2018-01-05 DIAGNOSIS — D509 Iron deficiency anemia, unspecified: Secondary | ICD-10-CM | POA: Diagnosis not present

## 2018-01-05 DIAGNOSIS — D519 Vitamin B12 deficiency anemia, unspecified: Secondary | ICD-10-CM | POA: Diagnosis not present

## 2018-01-06 DIAGNOSIS — J449 Chronic obstructive pulmonary disease, unspecified: Secondary | ICD-10-CM | POA: Diagnosis not present

## 2018-01-06 DIAGNOSIS — J9601 Acute respiratory failure with hypoxia: Secondary | ICD-10-CM | POA: Diagnosis not present

## 2018-01-06 DIAGNOSIS — J441 Chronic obstructive pulmonary disease with (acute) exacerbation: Secondary | ICD-10-CM | POA: Diagnosis not present

## 2018-01-06 DIAGNOSIS — J180 Bronchopneumonia, unspecified organism: Secondary | ICD-10-CM | POA: Diagnosis not present

## 2018-01-06 DIAGNOSIS — R0600 Dyspnea, unspecified: Secondary | ICD-10-CM | POA: Diagnosis not present

## 2018-01-06 DIAGNOSIS — R0602 Shortness of breath: Secondary | ICD-10-CM | POA: Diagnosis not present

## 2018-01-07 DIAGNOSIS — N390 Urinary tract infection, site not specified: Secondary | ICD-10-CM | POA: Diagnosis not present

## 2018-01-07 DIAGNOSIS — R809 Proteinuria, unspecified: Secondary | ICD-10-CM | POA: Diagnosis not present

## 2018-01-07 DIAGNOSIS — I10 Essential (primary) hypertension: Secondary | ICD-10-CM | POA: Diagnosis not present

## 2018-01-07 DIAGNOSIS — E559 Vitamin D deficiency, unspecified: Secondary | ICD-10-CM | POA: Diagnosis not present

## 2018-01-07 DIAGNOSIS — N183 Chronic kidney disease, stage 3 (moderate): Secondary | ICD-10-CM | POA: Diagnosis not present

## 2018-01-10 DIAGNOSIS — J9601 Acute respiratory failure with hypoxia: Secondary | ICD-10-CM | POA: Diagnosis not present

## 2018-01-10 DIAGNOSIS — J180 Bronchopneumonia, unspecified organism: Secondary | ICD-10-CM | POA: Diagnosis not present

## 2018-01-10 DIAGNOSIS — E860 Dehydration: Secondary | ICD-10-CM | POA: Diagnosis not present

## 2018-01-10 DIAGNOSIS — J441 Chronic obstructive pulmonary disease with (acute) exacerbation: Secondary | ICD-10-CM | POA: Diagnosis not present

## 2018-01-12 DIAGNOSIS — M7061 Trochanteric bursitis, right hip: Secondary | ICD-10-CM | POA: Diagnosis not present

## 2018-01-12 DIAGNOSIS — G894 Chronic pain syndrome: Secondary | ICD-10-CM | POA: Diagnosis not present

## 2018-01-12 DIAGNOSIS — M961 Postlaminectomy syndrome, not elsewhere classified: Secondary | ICD-10-CM | POA: Diagnosis not present

## 2018-01-12 DIAGNOSIS — Q761 Klippel-Feil syndrome: Secondary | ICD-10-CM | POA: Diagnosis not present

## 2018-01-12 DIAGNOSIS — Z79891 Long term (current) use of opiate analgesic: Secondary | ICD-10-CM | POA: Diagnosis not present

## 2018-01-12 DIAGNOSIS — F1120 Opioid dependence, uncomplicated: Secondary | ICD-10-CM | POA: Diagnosis not present

## 2018-01-12 DIAGNOSIS — S32008A Other fracture of unspecified lumbar vertebra, initial encounter for closed fracture: Secondary | ICD-10-CM | POA: Diagnosis not present

## 2018-01-12 DIAGNOSIS — J449 Chronic obstructive pulmonary disease, unspecified: Secondary | ICD-10-CM | POA: Diagnosis not present

## 2018-01-12 DIAGNOSIS — M461 Sacroiliitis, not elsewhere classified: Secondary | ICD-10-CM | POA: Diagnosis not present

## 2018-01-13 DIAGNOSIS — J9610 Chronic respiratory failure, unspecified whether with hypoxia or hypercapnia: Secondary | ICD-10-CM | POA: Diagnosis not present

## 2018-01-13 DIAGNOSIS — R7301 Impaired fasting glucose: Secondary | ICD-10-CM | POA: Diagnosis not present

## 2018-01-13 DIAGNOSIS — J449 Chronic obstructive pulmonary disease, unspecified: Secondary | ICD-10-CM | POA: Diagnosis not present

## 2018-01-13 DIAGNOSIS — N183 Chronic kidney disease, stage 3 (moderate): Secondary | ICD-10-CM | POA: Diagnosis not present

## 2018-01-13 DIAGNOSIS — Z9981 Dependence on supplemental oxygen: Secondary | ICD-10-CM | POA: Diagnosis not present

## 2018-01-14 DIAGNOSIS — E559 Vitamin D deficiency, unspecified: Secondary | ICD-10-CM | POA: Diagnosis not present

## 2018-01-14 DIAGNOSIS — I10 Essential (primary) hypertension: Secondary | ICD-10-CM | POA: Diagnosis not present

## 2018-01-14 DIAGNOSIS — N183 Chronic kidney disease, stage 3 (moderate): Secondary | ICD-10-CM | POA: Diagnosis not present

## 2018-01-19 DIAGNOSIS — G4733 Obstructive sleep apnea (adult) (pediatric): Secondary | ICD-10-CM | POA: Diagnosis not present

## 2018-01-20 DIAGNOSIS — E559 Vitamin D deficiency, unspecified: Secondary | ICD-10-CM | POA: Diagnosis not present

## 2018-01-20 DIAGNOSIS — I10 Essential (primary) hypertension: Secondary | ICD-10-CM | POA: Diagnosis not present

## 2018-01-20 DIAGNOSIS — N183 Chronic kidney disease, stage 3 (moderate): Secondary | ICD-10-CM | POA: Diagnosis not present

## 2018-01-27 DIAGNOSIS — Z6839 Body mass index (BMI) 39.0-39.9, adult: Secondary | ICD-10-CM | POA: Diagnosis not present

## 2018-01-27 DIAGNOSIS — M25551 Pain in right hip: Secondary | ICD-10-CM | POA: Diagnosis not present

## 2018-01-27 DIAGNOSIS — Z139 Encounter for screening, unspecified: Secondary | ICD-10-CM | POA: Diagnosis not present

## 2018-01-27 DIAGNOSIS — J449 Chronic obstructive pulmonary disease, unspecified: Secondary | ICD-10-CM | POA: Diagnosis not present

## 2018-01-28 DIAGNOSIS — M961 Postlaminectomy syndrome, not elsewhere classified: Secondary | ICD-10-CM | POA: Diagnosis not present

## 2018-01-28 DIAGNOSIS — G894 Chronic pain syndrome: Secondary | ICD-10-CM | POA: Diagnosis not present

## 2018-01-28 DIAGNOSIS — Z79891 Long term (current) use of opiate analgesic: Secondary | ICD-10-CM | POA: Diagnosis not present

## 2018-01-28 DIAGNOSIS — Q761 Klippel-Feil syndrome: Secondary | ICD-10-CM | POA: Diagnosis not present

## 2018-01-28 DIAGNOSIS — S32008A Other fracture of unspecified lumbar vertebra, initial encounter for closed fracture: Secondary | ICD-10-CM | POA: Diagnosis not present

## 2018-01-28 DIAGNOSIS — N183 Chronic kidney disease, stage 3 (moderate): Secondary | ICD-10-CM | POA: Diagnosis not present

## 2018-01-28 DIAGNOSIS — E559 Vitamin D deficiency, unspecified: Secondary | ICD-10-CM | POA: Diagnosis not present

## 2018-01-28 DIAGNOSIS — J449 Chronic obstructive pulmonary disease, unspecified: Secondary | ICD-10-CM | POA: Diagnosis not present

## 2018-01-28 DIAGNOSIS — I10 Essential (primary) hypertension: Secondary | ICD-10-CM | POA: Diagnosis not present

## 2018-01-28 DIAGNOSIS — M7061 Trochanteric bursitis, right hip: Secondary | ICD-10-CM | POA: Diagnosis not present

## 2018-01-28 DIAGNOSIS — M461 Sacroiliitis, not elsewhere classified: Secondary | ICD-10-CM | POA: Diagnosis not present

## 2018-01-28 DIAGNOSIS — F1120 Opioid dependence, uncomplicated: Secondary | ICD-10-CM | POA: Diagnosis not present

## 2018-02-02 DIAGNOSIS — G4733 Obstructive sleep apnea (adult) (pediatric): Secondary | ICD-10-CM | POA: Diagnosis not present

## 2018-02-02 DIAGNOSIS — R5383 Other fatigue: Secondary | ICD-10-CM | POA: Diagnosis not present

## 2018-02-02 DIAGNOSIS — J449 Chronic obstructive pulmonary disease, unspecified: Secondary | ICD-10-CM | POA: Diagnosis not present

## 2018-02-02 DIAGNOSIS — Z87891 Personal history of nicotine dependence: Secondary | ICD-10-CM | POA: Diagnosis not present

## 2018-02-03 DIAGNOSIS — M1611 Unilateral primary osteoarthritis, right hip: Secondary | ICD-10-CM | POA: Diagnosis not present

## 2018-02-04 DIAGNOSIS — N183 Chronic kidney disease, stage 3 (moderate): Secondary | ICD-10-CM | POA: Diagnosis not present

## 2018-02-04 DIAGNOSIS — I10 Essential (primary) hypertension: Secondary | ICD-10-CM | POA: Diagnosis not present

## 2018-02-04 DIAGNOSIS — E559 Vitamin D deficiency, unspecified: Secondary | ICD-10-CM | POA: Diagnosis not present

## 2018-02-06 DIAGNOSIS — J441 Chronic obstructive pulmonary disease with (acute) exacerbation: Secondary | ICD-10-CM | POA: Diagnosis not present

## 2018-02-06 DIAGNOSIS — J449 Chronic obstructive pulmonary disease, unspecified: Secondary | ICD-10-CM | POA: Diagnosis not present

## 2018-02-06 DIAGNOSIS — J180 Bronchopneumonia, unspecified organism: Secondary | ICD-10-CM | POA: Diagnosis not present

## 2018-02-06 DIAGNOSIS — J9601 Acute respiratory failure with hypoxia: Secondary | ICD-10-CM | POA: Diagnosis not present

## 2018-02-09 DIAGNOSIS — J441 Chronic obstructive pulmonary disease with (acute) exacerbation: Secondary | ICD-10-CM | POA: Diagnosis not present

## 2018-02-09 DIAGNOSIS — E860 Dehydration: Secondary | ICD-10-CM | POA: Diagnosis not present

## 2018-02-09 DIAGNOSIS — J180 Bronchopneumonia, unspecified organism: Secondary | ICD-10-CM | POA: Diagnosis not present

## 2018-02-09 DIAGNOSIS — J9601 Acute respiratory failure with hypoxia: Secondary | ICD-10-CM | POA: Diagnosis not present

## 2018-02-16 DIAGNOSIS — M25551 Pain in right hip: Secondary | ICD-10-CM | POA: Diagnosis not present

## 2018-02-16 DIAGNOSIS — M1611 Unilateral primary osteoarthritis, right hip: Secondary | ICD-10-CM | POA: Diagnosis not present

## 2018-02-17 DIAGNOSIS — E559 Vitamin D deficiency, unspecified: Secondary | ICD-10-CM | POA: Diagnosis not present

## 2018-02-17 DIAGNOSIS — N183 Chronic kidney disease, stage 3 (moderate): Secondary | ICD-10-CM | POA: Diagnosis not present

## 2018-02-17 DIAGNOSIS — I10 Essential (primary) hypertension: Secondary | ICD-10-CM | POA: Diagnosis not present

## 2018-03-03 DIAGNOSIS — F1120 Opioid dependence, uncomplicated: Secondary | ICD-10-CM | POA: Diagnosis not present

## 2018-03-03 DIAGNOSIS — Z79891 Long term (current) use of opiate analgesic: Secondary | ICD-10-CM | POA: Diagnosis not present

## 2018-03-03 DIAGNOSIS — M7061 Trochanteric bursitis, right hip: Secondary | ICD-10-CM | POA: Diagnosis not present

## 2018-03-03 DIAGNOSIS — M461 Sacroiliitis, not elsewhere classified: Secondary | ICD-10-CM | POA: Diagnosis not present

## 2018-03-03 DIAGNOSIS — G894 Chronic pain syndrome: Secondary | ICD-10-CM | POA: Diagnosis not present

## 2018-03-03 DIAGNOSIS — J449 Chronic obstructive pulmonary disease, unspecified: Secondary | ICD-10-CM | POA: Diagnosis not present

## 2018-03-03 DIAGNOSIS — Q761 Klippel-Feil syndrome: Secondary | ICD-10-CM | POA: Diagnosis not present

## 2018-03-03 DIAGNOSIS — M961 Postlaminectomy syndrome, not elsewhere classified: Secondary | ICD-10-CM | POA: Diagnosis not present

## 2018-03-03 DIAGNOSIS — S32008A Other fracture of unspecified lumbar vertebra, initial encounter for closed fracture: Secondary | ICD-10-CM | POA: Diagnosis not present

## 2018-03-08 DIAGNOSIS — J180 Bronchopneumonia, unspecified organism: Secondary | ICD-10-CM | POA: Diagnosis not present

## 2018-03-08 DIAGNOSIS — J449 Chronic obstructive pulmonary disease, unspecified: Secondary | ICD-10-CM | POA: Diagnosis not present

## 2018-03-08 DIAGNOSIS — J9601 Acute respiratory failure with hypoxia: Secondary | ICD-10-CM | POA: Diagnosis not present

## 2018-03-08 DIAGNOSIS — J441 Chronic obstructive pulmonary disease with (acute) exacerbation: Secondary | ICD-10-CM | POA: Diagnosis not present

## 2018-03-12 DIAGNOSIS — J180 Bronchopneumonia, unspecified organism: Secondary | ICD-10-CM | POA: Diagnosis not present

## 2018-03-12 DIAGNOSIS — E860 Dehydration: Secondary | ICD-10-CM | POA: Diagnosis not present

## 2018-03-12 DIAGNOSIS — J9601 Acute respiratory failure with hypoxia: Secondary | ICD-10-CM | POA: Diagnosis not present

## 2018-03-12 DIAGNOSIS — J441 Chronic obstructive pulmonary disease with (acute) exacerbation: Secondary | ICD-10-CM | POA: Diagnosis not present

## 2018-03-21 DIAGNOSIS — G4733 Obstructive sleep apnea (adult) (pediatric): Secondary | ICD-10-CM | POA: Diagnosis not present

## 2018-03-22 DIAGNOSIS — D649 Anemia, unspecified: Secondary | ICD-10-CM | POA: Diagnosis not present

## 2018-03-22 DIAGNOSIS — D631 Anemia in chronic kidney disease: Secondary | ICD-10-CM | POA: Diagnosis not present

## 2018-03-22 DIAGNOSIS — I10 Essential (primary) hypertension: Secondary | ICD-10-CM | POA: Diagnosis not present

## 2018-03-22 DIAGNOSIS — E785 Hyperlipidemia, unspecified: Secondary | ICD-10-CM | POA: Diagnosis not present

## 2018-03-22 DIAGNOSIS — D518 Other vitamin B12 deficiency anemias: Secondary | ICD-10-CM | POA: Diagnosis not present

## 2018-03-22 DIAGNOSIS — N183 Chronic kidney disease, stage 3 (moderate): Secondary | ICD-10-CM | POA: Diagnosis not present

## 2018-03-22 DIAGNOSIS — I119 Hypertensive heart disease without heart failure: Secondary | ICD-10-CM | POA: Diagnosis not present

## 2018-03-22 DIAGNOSIS — D509 Iron deficiency anemia, unspecified: Secondary | ICD-10-CM | POA: Diagnosis not present

## 2018-03-24 DIAGNOSIS — E785 Hyperlipidemia, unspecified: Secondary | ICD-10-CM | POA: Diagnosis not present

## 2018-03-24 DIAGNOSIS — I119 Hypertensive heart disease without heart failure: Secondary | ICD-10-CM | POA: Diagnosis not present

## 2018-03-24 DIAGNOSIS — Z1331 Encounter for screening for depression: Secondary | ICD-10-CM | POA: Diagnosis not present

## 2018-03-24 DIAGNOSIS — Z0000 Encounter for general adult medical examination without abnormal findings: Secondary | ICD-10-CM | POA: Diagnosis not present

## 2018-03-24 DIAGNOSIS — Z139 Encounter for screening, unspecified: Secondary | ICD-10-CM | POA: Diagnosis not present

## 2018-03-24 DIAGNOSIS — Z9181 History of falling: Secondary | ICD-10-CM | POA: Diagnosis not present

## 2018-03-24 DIAGNOSIS — E611 Iron deficiency: Secondary | ICD-10-CM | POA: Diagnosis not present

## 2018-03-24 DIAGNOSIS — J449 Chronic obstructive pulmonary disease, unspecified: Secondary | ICD-10-CM | POA: Diagnosis not present

## 2018-03-25 DIAGNOSIS — E559 Vitamin D deficiency, unspecified: Secondary | ICD-10-CM | POA: Diagnosis not present

## 2018-03-25 DIAGNOSIS — I10 Essential (primary) hypertension: Secondary | ICD-10-CM | POA: Diagnosis not present

## 2018-03-25 DIAGNOSIS — N183 Chronic kidney disease, stage 3 (moderate): Secondary | ICD-10-CM | POA: Diagnosis not present

## 2018-03-25 DIAGNOSIS — N390 Urinary tract infection, site not specified: Secondary | ICD-10-CM | POA: Diagnosis not present

## 2018-04-01 DIAGNOSIS — G894 Chronic pain syndrome: Secondary | ICD-10-CM | POA: Diagnosis not present

## 2018-04-01 DIAGNOSIS — Q761 Klippel-Feil syndrome: Secondary | ICD-10-CM | POA: Diagnosis not present

## 2018-04-01 DIAGNOSIS — M961 Postlaminectomy syndrome, not elsewhere classified: Secondary | ICD-10-CM | POA: Diagnosis not present

## 2018-04-01 DIAGNOSIS — Z79891 Long term (current) use of opiate analgesic: Secondary | ICD-10-CM | POA: Diagnosis not present

## 2018-04-01 DIAGNOSIS — M7061 Trochanteric bursitis, right hip: Secondary | ICD-10-CM | POA: Diagnosis not present

## 2018-04-01 DIAGNOSIS — M461 Sacroiliitis, not elsewhere classified: Secondary | ICD-10-CM | POA: Diagnosis not present

## 2018-04-01 DIAGNOSIS — S32008A Other fracture of unspecified lumbar vertebra, initial encounter for closed fracture: Secondary | ICD-10-CM | POA: Diagnosis not present

## 2018-04-01 DIAGNOSIS — F1120 Opioid dependence, uncomplicated: Secondary | ICD-10-CM | POA: Diagnosis not present

## 2018-04-01 DIAGNOSIS — J449 Chronic obstructive pulmonary disease, unspecified: Secondary | ICD-10-CM | POA: Diagnosis not present

## 2018-04-08 DIAGNOSIS — J441 Chronic obstructive pulmonary disease with (acute) exacerbation: Secondary | ICD-10-CM | POA: Diagnosis not present

## 2018-04-08 DIAGNOSIS — J449 Chronic obstructive pulmonary disease, unspecified: Secondary | ICD-10-CM | POA: Diagnosis not present

## 2018-04-08 DIAGNOSIS — J180 Bronchopneumonia, unspecified organism: Secondary | ICD-10-CM | POA: Diagnosis not present

## 2018-04-08 DIAGNOSIS — J9601 Acute respiratory failure with hypoxia: Secondary | ICD-10-CM | POA: Diagnosis not present

## 2018-04-12 DIAGNOSIS — J180 Bronchopneumonia, unspecified organism: Secondary | ICD-10-CM | POA: Diagnosis not present

## 2018-04-12 DIAGNOSIS — J9601 Acute respiratory failure with hypoxia: Secondary | ICD-10-CM | POA: Diagnosis not present

## 2018-04-12 DIAGNOSIS — E860 Dehydration: Secondary | ICD-10-CM | POA: Diagnosis not present

## 2018-04-12 DIAGNOSIS — J441 Chronic obstructive pulmonary disease with (acute) exacerbation: Secondary | ICD-10-CM | POA: Diagnosis not present

## 2018-04-18 DIAGNOSIS — D649 Anemia, unspecified: Secondary | ICD-10-CM | POA: Diagnosis not present

## 2018-04-21 DIAGNOSIS — G4733 Obstructive sleep apnea (adult) (pediatric): Secondary | ICD-10-CM | POA: Diagnosis not present

## 2018-05-02 DIAGNOSIS — N189 Chronic kidney disease, unspecified: Secondary | ICD-10-CM | POA: Diagnosis not present

## 2018-05-02 DIAGNOSIS — D649 Anemia, unspecified: Secondary | ICD-10-CM | POA: Diagnosis not present

## 2018-05-02 DIAGNOSIS — Z79899 Other long term (current) drug therapy: Secondary | ICD-10-CM

## 2018-05-02 DIAGNOSIS — R768 Other specified abnormal immunological findings in serum: Secondary | ICD-10-CM | POA: Diagnosis not present

## 2018-05-02 DIAGNOSIS — D509 Iron deficiency anemia, unspecified: Secondary | ICD-10-CM | POA: Diagnosis not present

## 2018-05-02 DIAGNOSIS — N289 Disorder of kidney and ureter, unspecified: Secondary | ICD-10-CM | POA: Diagnosis not present

## 2018-05-02 DIAGNOSIS — R7989 Other specified abnormal findings of blood chemistry: Secondary | ICD-10-CM | POA: Diagnosis not present

## 2018-05-02 DIAGNOSIS — D631 Anemia in chronic kidney disease: Secondary | ICD-10-CM | POA: Diagnosis not present

## 2018-05-04 DIAGNOSIS — S32008A Other fracture of unspecified lumbar vertebra, initial encounter for closed fracture: Secondary | ICD-10-CM | POA: Diagnosis not present

## 2018-05-04 DIAGNOSIS — M461 Sacroiliitis, not elsewhere classified: Secondary | ICD-10-CM | POA: Diagnosis not present

## 2018-05-04 DIAGNOSIS — M7061 Trochanteric bursitis, right hip: Secondary | ICD-10-CM | POA: Diagnosis not present

## 2018-05-04 DIAGNOSIS — J449 Chronic obstructive pulmonary disease, unspecified: Secondary | ICD-10-CM | POA: Diagnosis not present

## 2018-05-04 DIAGNOSIS — G894 Chronic pain syndrome: Secondary | ICD-10-CM | POA: Diagnosis not present

## 2018-05-04 DIAGNOSIS — M961 Postlaminectomy syndrome, not elsewhere classified: Secondary | ICD-10-CM | POA: Diagnosis not present

## 2018-05-04 DIAGNOSIS — Z79891 Long term (current) use of opiate analgesic: Secondary | ICD-10-CM | POA: Diagnosis not present

## 2018-05-04 DIAGNOSIS — F1120 Opioid dependence, uncomplicated: Secondary | ICD-10-CM | POA: Diagnosis not present

## 2018-05-04 DIAGNOSIS — Q761 Klippel-Feil syndrome: Secondary | ICD-10-CM | POA: Diagnosis not present

## 2018-05-04 DIAGNOSIS — G4733 Obstructive sleep apnea (adult) (pediatric): Secondary | ICD-10-CM | POA: Diagnosis not present

## 2018-05-05 DIAGNOSIS — J449 Chronic obstructive pulmonary disease, unspecified: Secondary | ICD-10-CM | POA: Diagnosis not present

## 2018-05-05 DIAGNOSIS — Z23 Encounter for immunization: Secondary | ICD-10-CM | POA: Diagnosis not present

## 2018-05-05 DIAGNOSIS — R5383 Other fatigue: Secondary | ICD-10-CM | POA: Diagnosis not present

## 2018-05-05 DIAGNOSIS — G4733 Obstructive sleep apnea (adult) (pediatric): Secondary | ICD-10-CM | POA: Diagnosis not present

## 2018-05-09 DIAGNOSIS — J180 Bronchopneumonia, unspecified organism: Secondary | ICD-10-CM | POA: Diagnosis not present

## 2018-05-09 DIAGNOSIS — J449 Chronic obstructive pulmonary disease, unspecified: Secondary | ICD-10-CM | POA: Diagnosis not present

## 2018-05-09 DIAGNOSIS — J9601 Acute respiratory failure with hypoxia: Secondary | ICD-10-CM | POA: Diagnosis not present

## 2018-05-09 DIAGNOSIS — J441 Chronic obstructive pulmonary disease with (acute) exacerbation: Secondary | ICD-10-CM | POA: Diagnosis not present

## 2018-05-12 DIAGNOSIS — J441 Chronic obstructive pulmonary disease with (acute) exacerbation: Secondary | ICD-10-CM | POA: Diagnosis not present

## 2018-05-12 DIAGNOSIS — J180 Bronchopneumonia, unspecified organism: Secondary | ICD-10-CM | POA: Diagnosis not present

## 2018-05-12 DIAGNOSIS — E860 Dehydration: Secondary | ICD-10-CM | POA: Diagnosis not present

## 2018-05-12 DIAGNOSIS — J9601 Acute respiratory failure with hypoxia: Secondary | ICD-10-CM | POA: Diagnosis not present

## 2018-05-20 DIAGNOSIS — G4733 Obstructive sleep apnea (adult) (pediatric): Secondary | ICD-10-CM | POA: Diagnosis not present

## 2018-05-21 DIAGNOSIS — G4733 Obstructive sleep apnea (adult) (pediatric): Secondary | ICD-10-CM | POA: Diagnosis not present

## 2018-05-31 DIAGNOSIS — M7061 Trochanteric bursitis, right hip: Secondary | ICD-10-CM | POA: Diagnosis not present

## 2018-05-31 DIAGNOSIS — Q761 Klippel-Feil syndrome: Secondary | ICD-10-CM | POA: Diagnosis not present

## 2018-05-31 DIAGNOSIS — M461 Sacroiliitis, not elsewhere classified: Secondary | ICD-10-CM | POA: Diagnosis not present

## 2018-05-31 DIAGNOSIS — F1120 Opioid dependence, uncomplicated: Secondary | ICD-10-CM | POA: Diagnosis not present

## 2018-05-31 DIAGNOSIS — S32008A Other fracture of unspecified lumbar vertebra, initial encounter for closed fracture: Secondary | ICD-10-CM | POA: Diagnosis not present

## 2018-05-31 DIAGNOSIS — Z79891 Long term (current) use of opiate analgesic: Secondary | ICD-10-CM | POA: Diagnosis not present

## 2018-05-31 DIAGNOSIS — J449 Chronic obstructive pulmonary disease, unspecified: Secondary | ICD-10-CM | POA: Diagnosis not present

## 2018-05-31 DIAGNOSIS — G894 Chronic pain syndrome: Secondary | ICD-10-CM | POA: Diagnosis not present

## 2018-05-31 DIAGNOSIS — M961 Postlaminectomy syndrome, not elsewhere classified: Secondary | ICD-10-CM | POA: Diagnosis not present

## 2018-06-03 DIAGNOSIS — N183 Chronic kidney disease, stage 3 (moderate): Secondary | ICD-10-CM | POA: Diagnosis not present

## 2018-06-03 DIAGNOSIS — N390 Urinary tract infection, site not specified: Secondary | ICD-10-CM | POA: Diagnosis not present

## 2018-06-03 DIAGNOSIS — I10 Essential (primary) hypertension: Secondary | ICD-10-CM | POA: Diagnosis not present

## 2018-06-03 DIAGNOSIS — E559 Vitamin D deficiency, unspecified: Secondary | ICD-10-CM | POA: Diagnosis not present

## 2018-06-08 DIAGNOSIS — J449 Chronic obstructive pulmonary disease, unspecified: Secondary | ICD-10-CM | POA: Diagnosis not present

## 2018-06-08 DIAGNOSIS — J441 Chronic obstructive pulmonary disease with (acute) exacerbation: Secondary | ICD-10-CM | POA: Diagnosis not present

## 2018-06-08 DIAGNOSIS — J9601 Acute respiratory failure with hypoxia: Secondary | ICD-10-CM | POA: Diagnosis not present

## 2018-06-08 DIAGNOSIS — J180 Bronchopneumonia, unspecified organism: Secondary | ICD-10-CM | POA: Diagnosis not present

## 2018-06-12 DIAGNOSIS — E860 Dehydration: Secondary | ICD-10-CM | POA: Diagnosis not present

## 2018-06-12 DIAGNOSIS — J441 Chronic obstructive pulmonary disease with (acute) exacerbation: Secondary | ICD-10-CM | POA: Diagnosis not present

## 2018-06-12 DIAGNOSIS — J9601 Acute respiratory failure with hypoxia: Secondary | ICD-10-CM | POA: Diagnosis not present

## 2018-06-12 DIAGNOSIS — J180 Bronchopneumonia, unspecified organism: Secondary | ICD-10-CM | POA: Diagnosis not present

## 2018-06-14 DIAGNOSIS — R1031 Right lower quadrant pain: Secondary | ICD-10-CM | POA: Diagnosis not present

## 2018-06-14 DIAGNOSIS — G8929 Other chronic pain: Secondary | ICD-10-CM | POA: Diagnosis not present

## 2018-06-14 DIAGNOSIS — N950 Postmenopausal bleeding: Secondary | ICD-10-CM | POA: Diagnosis not present

## 2018-06-14 DIAGNOSIS — Z86018 Personal history of other benign neoplasm: Secondary | ICD-10-CM | POA: Diagnosis not present

## 2018-06-14 DIAGNOSIS — D649 Anemia, unspecified: Secondary | ICD-10-CM | POA: Diagnosis not present

## 2018-06-14 DIAGNOSIS — N055 Unspecified nephritic syndrome with diffuse mesangiocapillary glomerulonephritis: Secondary | ICD-10-CM | POA: Diagnosis not present

## 2018-06-14 DIAGNOSIS — D251 Intramural leiomyoma of uterus: Secondary | ICD-10-CM | POA: Diagnosis not present

## 2018-06-14 DIAGNOSIS — Z139 Encounter for screening, unspecified: Secondary | ICD-10-CM | POA: Diagnosis not present

## 2018-06-29 DIAGNOSIS — G894 Chronic pain syndrome: Secondary | ICD-10-CM | POA: Diagnosis not present

## 2018-06-29 DIAGNOSIS — M461 Sacroiliitis, not elsewhere classified: Secondary | ICD-10-CM | POA: Diagnosis not present

## 2018-06-29 DIAGNOSIS — D518 Other vitamin B12 deficiency anemias: Secondary | ICD-10-CM | POA: Diagnosis not present

## 2018-06-29 DIAGNOSIS — J449 Chronic obstructive pulmonary disease, unspecified: Secondary | ICD-10-CM | POA: Diagnosis not present

## 2018-06-29 DIAGNOSIS — Z79891 Long term (current) use of opiate analgesic: Secondary | ICD-10-CM | POA: Diagnosis not present

## 2018-06-29 DIAGNOSIS — I10 Essential (primary) hypertension: Secondary | ICD-10-CM | POA: Diagnosis not present

## 2018-06-29 DIAGNOSIS — D509 Iron deficiency anemia, unspecified: Secondary | ICD-10-CM | POA: Diagnosis not present

## 2018-06-29 DIAGNOSIS — N183 Chronic kidney disease, stage 3 (moderate): Secondary | ICD-10-CM | POA: Diagnosis not present

## 2018-06-29 DIAGNOSIS — Q761 Klippel-Feil syndrome: Secondary | ICD-10-CM | POA: Diagnosis not present

## 2018-06-29 DIAGNOSIS — D631 Anemia in chronic kidney disease: Secondary | ICD-10-CM | POA: Diagnosis not present

## 2018-06-29 DIAGNOSIS — M7061 Trochanteric bursitis, right hip: Secondary | ICD-10-CM | POA: Diagnosis not present

## 2018-06-29 DIAGNOSIS — M961 Postlaminectomy syndrome, not elsewhere classified: Secondary | ICD-10-CM | POA: Diagnosis not present

## 2018-06-29 DIAGNOSIS — S32008A Other fracture of unspecified lumbar vertebra, initial encounter for closed fracture: Secondary | ICD-10-CM | POA: Diagnosis not present

## 2018-06-29 DIAGNOSIS — F1120 Opioid dependence, uncomplicated: Secondary | ICD-10-CM | POA: Diagnosis not present

## 2018-07-01 DIAGNOSIS — M461 Sacroiliitis, not elsewhere classified: Secondary | ICD-10-CM | POA: Diagnosis not present

## 2018-07-01 DIAGNOSIS — M961 Postlaminectomy syndrome, not elsewhere classified: Secondary | ICD-10-CM | POA: Diagnosis not present

## 2018-07-01 DIAGNOSIS — M7061 Trochanteric bursitis, right hip: Secondary | ICD-10-CM | POA: Diagnosis not present

## 2018-07-01 DIAGNOSIS — J449 Chronic obstructive pulmonary disease, unspecified: Secondary | ICD-10-CM | POA: Diagnosis not present

## 2018-07-01 DIAGNOSIS — G894 Chronic pain syndrome: Secondary | ICD-10-CM | POA: Diagnosis not present

## 2018-07-01 DIAGNOSIS — Z79891 Long term (current) use of opiate analgesic: Secondary | ICD-10-CM | POA: Diagnosis not present

## 2018-07-01 DIAGNOSIS — Q761 Klippel-Feil syndrome: Secondary | ICD-10-CM | POA: Diagnosis not present

## 2018-07-01 DIAGNOSIS — S32008A Other fracture of unspecified lumbar vertebra, initial encounter for closed fracture: Secondary | ICD-10-CM | POA: Diagnosis not present

## 2018-07-01 DIAGNOSIS — F1120 Opioid dependence, uncomplicated: Secondary | ICD-10-CM | POA: Diagnosis not present

## 2018-07-04 DIAGNOSIS — D631 Anemia in chronic kidney disease: Secondary | ICD-10-CM | POA: Diagnosis not present

## 2018-07-04 DIAGNOSIS — N183 Chronic kidney disease, stage 3 (moderate): Secondary | ICD-10-CM | POA: Diagnosis not present

## 2018-07-04 DIAGNOSIS — I10 Essential (primary) hypertension: Secondary | ICD-10-CM | POA: Diagnosis not present

## 2018-07-09 DIAGNOSIS — J441 Chronic obstructive pulmonary disease with (acute) exacerbation: Secondary | ICD-10-CM | POA: Diagnosis not present

## 2018-07-09 DIAGNOSIS — J180 Bronchopneumonia, unspecified organism: Secondary | ICD-10-CM | POA: Diagnosis not present

## 2018-07-09 DIAGNOSIS — J9601 Acute respiratory failure with hypoxia: Secondary | ICD-10-CM | POA: Diagnosis not present

## 2018-07-09 DIAGNOSIS — J449 Chronic obstructive pulmonary disease, unspecified: Secondary | ICD-10-CM | POA: Diagnosis not present

## 2018-07-12 DIAGNOSIS — J180 Bronchopneumonia, unspecified organism: Secondary | ICD-10-CM | POA: Diagnosis not present

## 2018-07-12 DIAGNOSIS — J441 Chronic obstructive pulmonary disease with (acute) exacerbation: Secondary | ICD-10-CM | POA: Diagnosis not present

## 2018-07-12 DIAGNOSIS — E860 Dehydration: Secondary | ICD-10-CM | POA: Diagnosis not present

## 2018-07-12 DIAGNOSIS — J9601 Acute respiratory failure with hypoxia: Secondary | ICD-10-CM | POA: Diagnosis not present

## 2018-07-13 DIAGNOSIS — J439 Emphysema, unspecified: Secondary | ICD-10-CM | POA: Diagnosis not present

## 2018-07-13 DIAGNOSIS — I1310 Hypertensive heart and chronic kidney disease without heart failure, with stage 1 through stage 4 chronic kidney disease, or unspecified chronic kidney disease: Secondary | ICD-10-CM | POA: Diagnosis not present

## 2018-07-13 DIAGNOSIS — Z9981 Dependence on supplemental oxygen: Secondary | ICD-10-CM | POA: Diagnosis not present

## 2018-07-13 DIAGNOSIS — N183 Chronic kidney disease, stage 3 (moderate): Secondary | ICD-10-CM | POA: Diagnosis not present

## 2018-07-13 DIAGNOSIS — E7849 Other hyperlipidemia: Secondary | ICD-10-CM | POA: Diagnosis not present

## 2018-07-13 DIAGNOSIS — Z6841 Body Mass Index (BMI) 40.0 and over, adult: Secondary | ICD-10-CM | POA: Diagnosis not present

## 2018-07-13 DIAGNOSIS — R102 Pelvic and perineal pain: Secondary | ICD-10-CM | POA: Diagnosis not present

## 2018-07-13 DIAGNOSIS — J9611 Chronic respiratory failure with hypoxia: Secondary | ICD-10-CM | POA: Diagnosis not present

## 2018-07-13 DIAGNOSIS — D509 Iron deficiency anemia, unspecified: Secondary | ICD-10-CM | POA: Diagnosis not present

## 2018-07-13 DIAGNOSIS — I10 Essential (primary) hypertension: Secondary | ICD-10-CM | POA: Diagnosis not present

## 2018-07-13 DIAGNOSIS — D631 Anemia in chronic kidney disease: Secondary | ICD-10-CM | POA: Diagnosis not present

## 2018-07-18 DIAGNOSIS — N183 Chronic kidney disease, stage 3 (moderate): Secondary | ICD-10-CM | POA: Diagnosis not present

## 2018-07-18 DIAGNOSIS — D631 Anemia in chronic kidney disease: Secondary | ICD-10-CM | POA: Diagnosis not present

## 2018-07-18 DIAGNOSIS — I10 Essential (primary) hypertension: Secondary | ICD-10-CM | POA: Diagnosis not present

## 2018-07-26 DIAGNOSIS — S32008A Other fracture of unspecified lumbar vertebra, initial encounter for closed fracture: Secondary | ICD-10-CM | POA: Diagnosis not present

## 2018-07-26 DIAGNOSIS — Z79891 Long term (current) use of opiate analgesic: Secondary | ICD-10-CM | POA: Diagnosis not present

## 2018-07-26 DIAGNOSIS — M961 Postlaminectomy syndrome, not elsewhere classified: Secondary | ICD-10-CM | POA: Diagnosis not present

## 2018-07-26 DIAGNOSIS — I10 Essential (primary) hypertension: Secondary | ICD-10-CM | POA: Diagnosis not present

## 2018-07-26 DIAGNOSIS — G894 Chronic pain syndrome: Secondary | ICD-10-CM | POA: Diagnosis not present

## 2018-07-26 DIAGNOSIS — D631 Anemia in chronic kidney disease: Secondary | ICD-10-CM | POA: Diagnosis not present

## 2018-07-26 DIAGNOSIS — M7061 Trochanteric bursitis, right hip: Secondary | ICD-10-CM | POA: Diagnosis not present

## 2018-07-26 DIAGNOSIS — F1120 Opioid dependence, uncomplicated: Secondary | ICD-10-CM | POA: Diagnosis not present

## 2018-07-26 DIAGNOSIS — Q761 Klippel-Feil syndrome: Secondary | ICD-10-CM | POA: Diagnosis not present

## 2018-07-26 DIAGNOSIS — J449 Chronic obstructive pulmonary disease, unspecified: Secondary | ICD-10-CM | POA: Diagnosis not present

## 2018-07-26 DIAGNOSIS — M461 Sacroiliitis, not elsewhere classified: Secondary | ICD-10-CM | POA: Diagnosis not present

## 2018-07-26 DIAGNOSIS — N183 Chronic kidney disease, stage 3 (moderate): Secondary | ICD-10-CM | POA: Diagnosis not present

## 2018-07-27 DIAGNOSIS — K5730 Diverticulosis of large intestine without perforation or abscess without bleeding: Secondary | ICD-10-CM | POA: Diagnosis not present

## 2018-07-27 DIAGNOSIS — R102 Pelvic and perineal pain: Secondary | ICD-10-CM | POA: Diagnosis not present

## 2018-07-29 DIAGNOSIS — D692 Other nonthrombocytopenic purpura: Secondary | ICD-10-CM | POA: Diagnosis not present

## 2018-07-29 DIAGNOSIS — R102 Pelvic and perineal pain: Secondary | ICD-10-CM | POA: Diagnosis not present

## 2018-08-04 DIAGNOSIS — D631 Anemia in chronic kidney disease: Secondary | ICD-10-CM | POA: Diagnosis not present

## 2018-08-04 DIAGNOSIS — N183 Chronic kidney disease, stage 3 (moderate): Secondary | ICD-10-CM | POA: Diagnosis not present

## 2018-08-04 DIAGNOSIS — I10 Essential (primary) hypertension: Secondary | ICD-10-CM | POA: Diagnosis not present

## 2018-08-08 DIAGNOSIS — J449 Chronic obstructive pulmonary disease, unspecified: Secondary | ICD-10-CM | POA: Diagnosis not present

## 2018-08-08 DIAGNOSIS — J180 Bronchopneumonia, unspecified organism: Secondary | ICD-10-CM | POA: Diagnosis not present

## 2018-08-08 DIAGNOSIS — J9601 Acute respiratory failure with hypoxia: Secondary | ICD-10-CM | POA: Diagnosis not present

## 2018-08-08 DIAGNOSIS — J441 Chronic obstructive pulmonary disease with (acute) exacerbation: Secondary | ICD-10-CM | POA: Diagnosis not present

## 2018-08-12 DIAGNOSIS — J180 Bronchopneumonia, unspecified organism: Secondary | ICD-10-CM | POA: Diagnosis not present

## 2018-08-12 DIAGNOSIS — J441 Chronic obstructive pulmonary disease with (acute) exacerbation: Secondary | ICD-10-CM | POA: Diagnosis not present

## 2018-08-12 DIAGNOSIS — J9601 Acute respiratory failure with hypoxia: Secondary | ICD-10-CM | POA: Diagnosis not present

## 2018-08-12 DIAGNOSIS — E860 Dehydration: Secondary | ICD-10-CM | POA: Diagnosis not present

## 2018-08-15 DIAGNOSIS — G4733 Obstructive sleep apnea (adult) (pediatric): Secondary | ICD-10-CM | POA: Diagnosis not present

## 2018-08-16 DIAGNOSIS — R05 Cough: Secondary | ICD-10-CM | POA: Diagnosis not present

## 2018-08-16 DIAGNOSIS — J441 Chronic obstructive pulmonary disease with (acute) exacerbation: Secondary | ICD-10-CM | POA: Diagnosis not present

## 2018-08-16 DIAGNOSIS — J9611 Chronic respiratory failure with hypoxia: Secondary | ICD-10-CM | POA: Diagnosis not present

## 2018-08-16 DIAGNOSIS — G4733 Obstructive sleep apnea (adult) (pediatric): Secondary | ICD-10-CM | POA: Diagnosis not present

## 2018-08-16 DIAGNOSIS — R0600 Dyspnea, unspecified: Secondary | ICD-10-CM | POA: Diagnosis not present

## 2018-08-24 DIAGNOSIS — N189 Chronic kidney disease, unspecified: Secondary | ICD-10-CM | POA: Diagnosis not present

## 2018-08-24 DIAGNOSIS — D631 Anemia in chronic kidney disease: Secondary | ICD-10-CM | POA: Diagnosis not present

## 2018-08-25 DIAGNOSIS — G4733 Obstructive sleep apnea (adult) (pediatric): Secondary | ICD-10-CM | POA: Diagnosis not present

## 2018-08-30 DIAGNOSIS — Z79891 Long term (current) use of opiate analgesic: Secondary | ICD-10-CM | POA: Diagnosis not present

## 2018-08-30 DIAGNOSIS — S32008A Other fracture of unspecified lumbar vertebra, initial encounter for closed fracture: Secondary | ICD-10-CM | POA: Diagnosis not present

## 2018-08-30 DIAGNOSIS — M7061 Trochanteric bursitis, right hip: Secondary | ICD-10-CM | POA: Diagnosis not present

## 2018-08-30 DIAGNOSIS — M961 Postlaminectomy syndrome, not elsewhere classified: Secondary | ICD-10-CM | POA: Diagnosis not present

## 2018-08-30 DIAGNOSIS — G894 Chronic pain syndrome: Secondary | ICD-10-CM | POA: Diagnosis not present

## 2018-08-30 DIAGNOSIS — M461 Sacroiliitis, not elsewhere classified: Secondary | ICD-10-CM | POA: Diagnosis not present

## 2018-08-30 DIAGNOSIS — F1120 Opioid dependence, uncomplicated: Secondary | ICD-10-CM | POA: Diagnosis not present

## 2018-08-30 DIAGNOSIS — Q761 Klippel-Feil syndrome: Secondary | ICD-10-CM | POA: Diagnosis not present

## 2018-08-30 DIAGNOSIS — J449 Chronic obstructive pulmonary disease, unspecified: Secondary | ICD-10-CM | POA: Diagnosis not present

## 2018-08-31 DIAGNOSIS — D649 Anemia, unspecified: Secondary | ICD-10-CM | POA: Diagnosis not present

## 2018-08-31 DIAGNOSIS — D631 Anemia in chronic kidney disease: Secondary | ICD-10-CM | POA: Diagnosis not present

## 2018-08-31 DIAGNOSIS — N189 Chronic kidney disease, unspecified: Secondary | ICD-10-CM | POA: Diagnosis not present

## 2018-08-31 DIAGNOSIS — D509 Iron deficiency anemia, unspecified: Secondary | ICD-10-CM | POA: Diagnosis not present

## 2018-08-31 DIAGNOSIS — D472 Monoclonal gammopathy: Secondary | ICD-10-CM | POA: Diagnosis not present

## 2018-09-01 DIAGNOSIS — G4733 Obstructive sleep apnea (adult) (pediatric): Secondary | ICD-10-CM | POA: Diagnosis not present

## 2018-09-08 DIAGNOSIS — J180 Bronchopneumonia, unspecified organism: Secondary | ICD-10-CM | POA: Diagnosis not present

## 2018-09-08 DIAGNOSIS — I10 Essential (primary) hypertension: Secondary | ICD-10-CM | POA: Diagnosis not present

## 2018-09-08 DIAGNOSIS — J441 Chronic obstructive pulmonary disease with (acute) exacerbation: Secondary | ICD-10-CM | POA: Diagnosis not present

## 2018-09-08 DIAGNOSIS — N183 Chronic kidney disease, stage 3 (moderate): Secondary | ICD-10-CM | POA: Diagnosis not present

## 2018-09-08 DIAGNOSIS — J9601 Acute respiratory failure with hypoxia: Secondary | ICD-10-CM | POA: Diagnosis not present

## 2018-09-08 DIAGNOSIS — J449 Chronic obstructive pulmonary disease, unspecified: Secondary | ICD-10-CM | POA: Diagnosis not present

## 2018-09-08 DIAGNOSIS — E559 Vitamin D deficiency, unspecified: Secondary | ICD-10-CM | POA: Diagnosis not present

## 2018-09-08 DIAGNOSIS — N390 Urinary tract infection, site not specified: Secondary | ICD-10-CM | POA: Diagnosis not present

## 2018-09-12 DIAGNOSIS — N183 Chronic kidney disease, stage 3 (moderate): Secondary | ICD-10-CM | POA: Diagnosis not present

## 2018-09-12 DIAGNOSIS — I10 Essential (primary) hypertension: Secondary | ICD-10-CM | POA: Diagnosis not present

## 2018-09-12 DIAGNOSIS — J441 Chronic obstructive pulmonary disease with (acute) exacerbation: Secondary | ICD-10-CM | POA: Diagnosis not present

## 2018-09-12 DIAGNOSIS — J180 Bronchopneumonia, unspecified organism: Secondary | ICD-10-CM | POA: Diagnosis not present

## 2018-09-12 DIAGNOSIS — D631 Anemia in chronic kidney disease: Secondary | ICD-10-CM | POA: Diagnosis not present

## 2018-09-12 DIAGNOSIS — E860 Dehydration: Secondary | ICD-10-CM | POA: Diagnosis not present

## 2018-09-12 DIAGNOSIS — J9601 Acute respiratory failure with hypoxia: Secondary | ICD-10-CM | POA: Diagnosis not present

## 2018-09-21 DIAGNOSIS — I10 Essential (primary) hypertension: Secondary | ICD-10-CM | POA: Diagnosis not present

## 2018-09-21 DIAGNOSIS — N183 Chronic kidney disease, stage 3 (moderate): Secondary | ICD-10-CM | POA: Diagnosis not present

## 2018-09-21 DIAGNOSIS — D631 Anemia in chronic kidney disease: Secondary | ICD-10-CM | POA: Diagnosis not present

## 2018-09-26 DIAGNOSIS — N183 Chronic kidney disease, stage 3 (moderate): Secondary | ICD-10-CM | POA: Diagnosis not present

## 2018-09-26 DIAGNOSIS — D631 Anemia in chronic kidney disease: Secondary | ICD-10-CM | POA: Diagnosis not present

## 2018-09-26 DIAGNOSIS — I10 Essential (primary) hypertension: Secondary | ICD-10-CM | POA: Diagnosis not present

## 2018-09-29 DIAGNOSIS — Z79891 Long term (current) use of opiate analgesic: Secondary | ICD-10-CM | POA: Diagnosis not present

## 2018-09-29 DIAGNOSIS — G894 Chronic pain syndrome: Secondary | ICD-10-CM | POA: Diagnosis not present

## 2018-09-29 DIAGNOSIS — J449 Chronic obstructive pulmonary disease, unspecified: Secondary | ICD-10-CM | POA: Diagnosis not present

## 2018-09-29 DIAGNOSIS — M461 Sacroiliitis, not elsewhere classified: Secondary | ICD-10-CM | POA: Diagnosis not present

## 2018-09-29 DIAGNOSIS — F1120 Opioid dependence, uncomplicated: Secondary | ICD-10-CM | POA: Diagnosis not present

## 2018-09-29 DIAGNOSIS — M7061 Trochanteric bursitis, right hip: Secondary | ICD-10-CM | POA: Diagnosis not present

## 2018-09-29 DIAGNOSIS — N189 Chronic kidney disease, unspecified: Secondary | ICD-10-CM | POA: Diagnosis not present

## 2018-09-29 DIAGNOSIS — Q761 Klippel-Feil syndrome: Secondary | ICD-10-CM | POA: Diagnosis not present

## 2018-09-29 DIAGNOSIS — M961 Postlaminectomy syndrome, not elsewhere classified: Secondary | ICD-10-CM | POA: Diagnosis not present

## 2018-10-05 DIAGNOSIS — D692 Other nonthrombocytopenic purpura: Secondary | ICD-10-CM | POA: Diagnosis not present

## 2018-10-05 DIAGNOSIS — I10 Essential (primary) hypertension: Secondary | ICD-10-CM | POA: Diagnosis not present

## 2018-10-05 DIAGNOSIS — M79604 Pain in right leg: Secondary | ICD-10-CM | POA: Diagnosis not present

## 2018-10-05 DIAGNOSIS — N055 Unspecified nephritic syndrome with diffuse mesangiocapillary glomerulonephritis: Secondary | ICD-10-CM | POA: Diagnosis not present

## 2018-10-05 DIAGNOSIS — J9611 Chronic respiratory failure with hypoxia: Secondary | ICD-10-CM | POA: Diagnosis not present

## 2018-10-05 DIAGNOSIS — R7309 Other abnormal glucose: Secondary | ICD-10-CM | POA: Diagnosis not present

## 2018-10-05 DIAGNOSIS — J439 Emphysema, unspecified: Secondary | ICD-10-CM | POA: Diagnosis not present

## 2018-10-05 DIAGNOSIS — M79609 Pain in unspecified limb: Secondary | ICD-10-CM | POA: Diagnosis not present

## 2018-10-05 DIAGNOSIS — D631 Anemia in chronic kidney disease: Secondary | ICD-10-CM | POA: Diagnosis not present

## 2018-10-05 DIAGNOSIS — Z9981 Dependence on supplemental oxygen: Secondary | ICD-10-CM | POA: Diagnosis not present

## 2018-10-05 DIAGNOSIS — N183 Chronic kidney disease, stage 3 (moderate): Secondary | ICD-10-CM | POA: Diagnosis not present

## 2018-10-05 DIAGNOSIS — I1310 Hypertensive heart and chronic kidney disease without heart failure, with stage 1 through stage 4 chronic kidney disease, or unspecified chronic kidney disease: Secondary | ICD-10-CM | POA: Diagnosis not present

## 2018-10-06 DIAGNOSIS — M1611 Unilateral primary osteoarthritis, right hip: Secondary | ICD-10-CM | POA: Diagnosis not present

## 2018-10-06 DIAGNOSIS — M79604 Pain in right leg: Secondary | ICD-10-CM | POA: Diagnosis not present

## 2018-10-07 DIAGNOSIS — N183 Chronic kidney disease, stage 3 (moderate): Secondary | ICD-10-CM | POA: Diagnosis not present

## 2018-10-08 DIAGNOSIS — J449 Chronic obstructive pulmonary disease, unspecified: Secondary | ICD-10-CM | POA: Diagnosis not present

## 2018-10-08 DIAGNOSIS — J180 Bronchopneumonia, unspecified organism: Secondary | ICD-10-CM | POA: Diagnosis not present

## 2018-10-08 DIAGNOSIS — J441 Chronic obstructive pulmonary disease with (acute) exacerbation: Secondary | ICD-10-CM | POA: Diagnosis not present

## 2018-10-08 DIAGNOSIS — J9601 Acute respiratory failure with hypoxia: Secondary | ICD-10-CM | POA: Diagnosis not present

## 2018-10-10 DIAGNOSIS — D631 Anemia in chronic kidney disease: Secondary | ICD-10-CM | POA: Diagnosis not present

## 2018-10-10 DIAGNOSIS — N183 Chronic kidney disease, stage 3 (moderate): Secondary | ICD-10-CM | POA: Diagnosis not present

## 2018-10-10 DIAGNOSIS — I10 Essential (primary) hypertension: Secondary | ICD-10-CM | POA: Diagnosis not present

## 2018-10-11 DIAGNOSIS — E860 Dehydration: Secondary | ICD-10-CM | POA: Diagnosis not present

## 2018-10-11 DIAGNOSIS — J180 Bronchopneumonia, unspecified organism: Secondary | ICD-10-CM | POA: Diagnosis not present

## 2018-10-11 DIAGNOSIS — J441 Chronic obstructive pulmonary disease with (acute) exacerbation: Secondary | ICD-10-CM | POA: Diagnosis not present

## 2018-10-11 DIAGNOSIS — J9601 Acute respiratory failure with hypoxia: Secondary | ICD-10-CM | POA: Diagnosis not present

## 2018-10-19 DIAGNOSIS — M79604 Pain in right leg: Secondary | ICD-10-CM | POA: Diagnosis not present

## 2018-10-19 DIAGNOSIS — M1611 Unilateral primary osteoarthritis, right hip: Secondary | ICD-10-CM | POA: Diagnosis not present

## 2018-10-20 DIAGNOSIS — Z79899 Other long term (current) drug therapy: Secondary | ICD-10-CM | POA: Diagnosis not present

## 2018-10-20 DIAGNOSIS — R7303 Prediabetes: Secondary | ICD-10-CM | POA: Diagnosis not present

## 2018-10-20 DIAGNOSIS — I1310 Hypertensive heart and chronic kidney disease without heart failure, with stage 1 through stage 4 chronic kidney disease, or unspecified chronic kidney disease: Secondary | ICD-10-CM | POA: Diagnosis not present

## 2018-10-20 DIAGNOSIS — N183 Chronic kidney disease, stage 3 (moderate): Secondary | ICD-10-CM | POA: Diagnosis not present

## 2018-10-20 DIAGNOSIS — E782 Mixed hyperlipidemia: Secondary | ICD-10-CM | POA: Diagnosis not present

## 2018-10-20 DIAGNOSIS — E1165 Type 2 diabetes mellitus with hyperglycemia: Secondary | ICD-10-CM | POA: Diagnosis not present

## 2018-10-25 DIAGNOSIS — N189 Chronic kidney disease, unspecified: Secondary | ICD-10-CM | POA: Diagnosis not present

## 2018-10-25 DIAGNOSIS — M961 Postlaminectomy syndrome, not elsewhere classified: Secondary | ICD-10-CM | POA: Diagnosis not present

## 2018-10-25 DIAGNOSIS — Z79891 Long term (current) use of opiate analgesic: Secondary | ICD-10-CM | POA: Diagnosis not present

## 2018-10-25 DIAGNOSIS — M7061 Trochanteric bursitis, right hip: Secondary | ICD-10-CM | POA: Diagnosis not present

## 2018-10-25 DIAGNOSIS — J449 Chronic obstructive pulmonary disease, unspecified: Secondary | ICD-10-CM | POA: Diagnosis not present

## 2018-10-25 DIAGNOSIS — F1120 Opioid dependence, uncomplicated: Secondary | ICD-10-CM | POA: Diagnosis not present

## 2018-10-25 DIAGNOSIS — M461 Sacroiliitis, not elsewhere classified: Secondary | ICD-10-CM | POA: Diagnosis not present

## 2018-10-25 DIAGNOSIS — G894 Chronic pain syndrome: Secondary | ICD-10-CM | POA: Diagnosis not present

## 2018-10-25 DIAGNOSIS — Q761 Klippel-Feil syndrome: Secondary | ICD-10-CM | POA: Diagnosis not present

## 2018-11-07 DIAGNOSIS — D518 Other vitamin B12 deficiency anemias: Secondary | ICD-10-CM | POA: Diagnosis not present

## 2018-11-07 DIAGNOSIS — J449 Chronic obstructive pulmonary disease, unspecified: Secondary | ICD-10-CM | POA: Diagnosis not present

## 2018-11-07 DIAGNOSIS — N183 Chronic kidney disease, stage 3 (moderate): Secondary | ICD-10-CM | POA: Diagnosis not present

## 2018-11-07 DIAGNOSIS — J441 Chronic obstructive pulmonary disease with (acute) exacerbation: Secondary | ICD-10-CM | POA: Diagnosis not present

## 2018-11-07 DIAGNOSIS — J180 Bronchopneumonia, unspecified organism: Secondary | ICD-10-CM | POA: Diagnosis not present

## 2018-11-07 DIAGNOSIS — D509 Iron deficiency anemia, unspecified: Secondary | ICD-10-CM | POA: Diagnosis not present

## 2018-11-07 DIAGNOSIS — I10 Essential (primary) hypertension: Secondary | ICD-10-CM | POA: Diagnosis not present

## 2018-11-07 DIAGNOSIS — J9601 Acute respiratory failure with hypoxia: Secondary | ICD-10-CM | POA: Diagnosis not present

## 2018-11-11 DIAGNOSIS — J441 Chronic obstructive pulmonary disease with (acute) exacerbation: Secondary | ICD-10-CM | POA: Diagnosis not present

## 2018-11-11 DIAGNOSIS — J180 Bronchopneumonia, unspecified organism: Secondary | ICD-10-CM | POA: Diagnosis not present

## 2018-11-11 DIAGNOSIS — E559 Vitamin D deficiency, unspecified: Secondary | ICD-10-CM | POA: Diagnosis not present

## 2018-11-11 DIAGNOSIS — E860 Dehydration: Secondary | ICD-10-CM | POA: Diagnosis not present

## 2018-11-11 DIAGNOSIS — J9601 Acute respiratory failure with hypoxia: Secondary | ICD-10-CM | POA: Diagnosis not present

## 2018-11-11 DIAGNOSIS — N183 Chronic kidney disease, stage 3 (moderate): Secondary | ICD-10-CM | POA: Diagnosis not present

## 2018-11-11 DIAGNOSIS — I10 Essential (primary) hypertension: Secondary | ICD-10-CM | POA: Diagnosis not present

## 2018-11-22 DIAGNOSIS — M961 Postlaminectomy syndrome, not elsewhere classified: Secondary | ICD-10-CM | POA: Diagnosis not present

## 2018-11-22 DIAGNOSIS — G894 Chronic pain syndrome: Secondary | ICD-10-CM | POA: Diagnosis not present

## 2018-11-22 DIAGNOSIS — J449 Chronic obstructive pulmonary disease, unspecified: Secondary | ICD-10-CM | POA: Diagnosis not present

## 2018-11-22 DIAGNOSIS — Q761 Klippel-Feil syndrome: Secondary | ICD-10-CM | POA: Diagnosis not present

## 2018-11-22 DIAGNOSIS — M461 Sacroiliitis, not elsewhere classified: Secondary | ICD-10-CM | POA: Diagnosis not present

## 2018-11-22 DIAGNOSIS — N189 Chronic kidney disease, unspecified: Secondary | ICD-10-CM | POA: Diagnosis not present

## 2018-11-22 DIAGNOSIS — Z79891 Long term (current) use of opiate analgesic: Secondary | ICD-10-CM | POA: Diagnosis not present

## 2018-11-22 DIAGNOSIS — F1120 Opioid dependence, uncomplicated: Secondary | ICD-10-CM | POA: Diagnosis not present

## 2018-11-22 DIAGNOSIS — M7061 Trochanteric bursitis, right hip: Secondary | ICD-10-CM | POA: Diagnosis not present

## 2018-12-01 DIAGNOSIS — M1611 Unilateral primary osteoarthritis, right hip: Secondary | ICD-10-CM | POA: Diagnosis not present

## 2018-12-01 DIAGNOSIS — M25461 Effusion, right knee: Secondary | ICD-10-CM | POA: Diagnosis not present

## 2018-12-01 DIAGNOSIS — Z720 Tobacco use: Secondary | ICD-10-CM | POA: Diagnosis not present

## 2018-12-01 DIAGNOSIS — Z7902 Long term (current) use of antithrombotics/antiplatelets: Secondary | ICD-10-CM | POA: Diagnosis not present

## 2018-12-01 DIAGNOSIS — M25562 Pain in left knee: Secondary | ICD-10-CM | POA: Diagnosis not present

## 2018-12-01 DIAGNOSIS — M25561 Pain in right knee: Secondary | ICD-10-CM | POA: Diagnosis not present

## 2018-12-08 DIAGNOSIS — J441 Chronic obstructive pulmonary disease with (acute) exacerbation: Secondary | ICD-10-CM | POA: Diagnosis not present

## 2018-12-08 DIAGNOSIS — J9601 Acute respiratory failure with hypoxia: Secondary | ICD-10-CM | POA: Diagnosis not present

## 2018-12-08 DIAGNOSIS — J449 Chronic obstructive pulmonary disease, unspecified: Secondary | ICD-10-CM | POA: Diagnosis not present

## 2018-12-08 DIAGNOSIS — J180 Bronchopneumonia, unspecified organism: Secondary | ICD-10-CM | POA: Diagnosis not present

## 2018-12-15 DIAGNOSIS — G4733 Obstructive sleep apnea (adult) (pediatric): Secondary | ICD-10-CM | POA: Diagnosis not present

## 2018-12-15 DIAGNOSIS — J9611 Chronic respiratory failure with hypoxia: Secondary | ICD-10-CM | POA: Diagnosis not present

## 2018-12-15 DIAGNOSIS — J441 Chronic obstructive pulmonary disease with (acute) exacerbation: Secondary | ICD-10-CM | POA: Diagnosis not present

## 2018-12-15 DIAGNOSIS — R0600 Dyspnea, unspecified: Secondary | ICD-10-CM | POA: Diagnosis not present

## 2018-12-15 DIAGNOSIS — R05 Cough: Secondary | ICD-10-CM | POA: Diagnosis not present

## 2018-12-19 DIAGNOSIS — Q761 Klippel-Feil syndrome: Secondary | ICD-10-CM | POA: Diagnosis not present

## 2018-12-19 DIAGNOSIS — J449 Chronic obstructive pulmonary disease, unspecified: Secondary | ICD-10-CM | POA: Diagnosis not present

## 2018-12-19 DIAGNOSIS — Z79891 Long term (current) use of opiate analgesic: Secondary | ICD-10-CM | POA: Diagnosis not present

## 2018-12-19 DIAGNOSIS — N189 Chronic kidney disease, unspecified: Secondary | ICD-10-CM | POA: Diagnosis not present

## 2018-12-19 DIAGNOSIS — M961 Postlaminectomy syndrome, not elsewhere classified: Secondary | ICD-10-CM | POA: Diagnosis not present

## 2018-12-19 DIAGNOSIS — G894 Chronic pain syndrome: Secondary | ICD-10-CM | POA: Diagnosis not present

## 2018-12-19 DIAGNOSIS — M461 Sacroiliitis, not elsewhere classified: Secondary | ICD-10-CM | POA: Diagnosis not present

## 2018-12-19 DIAGNOSIS — M7061 Trochanteric bursitis, right hip: Secondary | ICD-10-CM | POA: Diagnosis not present

## 2018-12-19 DIAGNOSIS — F1120 Opioid dependence, uncomplicated: Secondary | ICD-10-CM | POA: Diagnosis not present

## 2019-01-07 DIAGNOSIS — J449 Chronic obstructive pulmonary disease, unspecified: Secondary | ICD-10-CM | POA: Diagnosis not present

## 2019-01-07 DIAGNOSIS — J441 Chronic obstructive pulmonary disease with (acute) exacerbation: Secondary | ICD-10-CM | POA: Diagnosis not present

## 2019-01-07 DIAGNOSIS — J180 Bronchopneumonia, unspecified organism: Secondary | ICD-10-CM | POA: Diagnosis not present

## 2019-01-07 DIAGNOSIS — J9601 Acute respiratory failure with hypoxia: Secondary | ICD-10-CM | POA: Diagnosis not present

## 2019-01-16 DIAGNOSIS — Q761 Klippel-Feil syndrome: Secondary | ICD-10-CM | POA: Diagnosis not present

## 2019-01-16 DIAGNOSIS — E611 Iron deficiency: Secondary | ICD-10-CM | POA: Diagnosis not present

## 2019-01-16 DIAGNOSIS — J449 Chronic obstructive pulmonary disease, unspecified: Secondary | ICD-10-CM | POA: Diagnosis not present

## 2019-01-16 DIAGNOSIS — N189 Chronic kidney disease, unspecified: Secondary | ICD-10-CM | POA: Diagnosis not present

## 2019-01-16 DIAGNOSIS — G894 Chronic pain syndrome: Secondary | ICD-10-CM | POA: Diagnosis not present

## 2019-01-16 DIAGNOSIS — N390 Urinary tract infection, site not specified: Secondary | ICD-10-CM | POA: Diagnosis not present

## 2019-01-16 DIAGNOSIS — I10 Essential (primary) hypertension: Secondary | ICD-10-CM | POA: Diagnosis not present

## 2019-01-16 DIAGNOSIS — N183 Chronic kidney disease, stage 3 (moderate): Secondary | ICD-10-CM | POA: Diagnosis not present

## 2019-01-16 DIAGNOSIS — Z79891 Long term (current) use of opiate analgesic: Secondary | ICD-10-CM | POA: Diagnosis not present

## 2019-01-16 DIAGNOSIS — M7061 Trochanteric bursitis, right hip: Secondary | ICD-10-CM | POA: Diagnosis not present

## 2019-01-16 DIAGNOSIS — M461 Sacroiliitis, not elsewhere classified: Secondary | ICD-10-CM | POA: Diagnosis not present

## 2019-01-16 DIAGNOSIS — D509 Iron deficiency anemia, unspecified: Secondary | ICD-10-CM | POA: Diagnosis not present

## 2019-01-16 DIAGNOSIS — M961 Postlaminectomy syndrome, not elsewhere classified: Secondary | ICD-10-CM | POA: Diagnosis not present

## 2019-01-16 DIAGNOSIS — F1120 Opioid dependence, uncomplicated: Secondary | ICD-10-CM | POA: Diagnosis not present

## 2019-01-26 DIAGNOSIS — D631 Anemia in chronic kidney disease: Secondary | ICD-10-CM | POA: Diagnosis not present

## 2019-01-26 DIAGNOSIS — N183 Chronic kidney disease, stage 3 (moderate): Secondary | ICD-10-CM | POA: Diagnosis not present

## 2019-01-26 DIAGNOSIS — I10 Essential (primary) hypertension: Secondary | ICD-10-CM | POA: Diagnosis not present

## 2019-01-30 DIAGNOSIS — N183 Chronic kidney disease, stage 3 (moderate): Secondary | ICD-10-CM | POA: Diagnosis not present

## 2019-01-30 DIAGNOSIS — I10 Essential (primary) hypertension: Secondary | ICD-10-CM | POA: Diagnosis not present

## 2019-01-30 DIAGNOSIS — D631 Anemia in chronic kidney disease: Secondary | ICD-10-CM | POA: Diagnosis not present

## 2019-02-07 DIAGNOSIS — J441 Chronic obstructive pulmonary disease with (acute) exacerbation: Secondary | ICD-10-CM | POA: Diagnosis not present

## 2019-02-07 DIAGNOSIS — J449 Chronic obstructive pulmonary disease, unspecified: Secondary | ICD-10-CM | POA: Diagnosis not present

## 2019-02-07 DIAGNOSIS — J180 Bronchopneumonia, unspecified organism: Secondary | ICD-10-CM | POA: Diagnosis not present

## 2019-02-07 DIAGNOSIS — J9601 Acute respiratory failure with hypoxia: Secondary | ICD-10-CM | POA: Diagnosis not present

## 2019-02-08 DIAGNOSIS — N183 Chronic kidney disease, stage 3 (moderate): Secondary | ICD-10-CM | POA: Diagnosis not present

## 2019-02-08 DIAGNOSIS — D631 Anemia in chronic kidney disease: Secondary | ICD-10-CM | POA: Diagnosis not present

## 2019-02-08 DIAGNOSIS — I10 Essential (primary) hypertension: Secondary | ICD-10-CM | POA: Diagnosis not present

## 2019-02-14 DIAGNOSIS — D631 Anemia in chronic kidney disease: Secondary | ICD-10-CM | POA: Diagnosis not present

## 2019-02-14 DIAGNOSIS — N183 Chronic kidney disease, stage 3 (moderate): Secondary | ICD-10-CM | POA: Diagnosis not present

## 2019-02-14 DIAGNOSIS — I10 Essential (primary) hypertension: Secondary | ICD-10-CM | POA: Diagnosis not present

## 2019-02-21 DIAGNOSIS — G4733 Obstructive sleep apnea (adult) (pediatric): Secondary | ICD-10-CM | POA: Diagnosis not present

## 2019-02-22 DIAGNOSIS — I10 Essential (primary) hypertension: Secondary | ICD-10-CM | POA: Diagnosis not present

## 2019-02-22 DIAGNOSIS — N183 Chronic kidney disease, stage 3 (moderate): Secondary | ICD-10-CM | POA: Diagnosis not present

## 2019-02-22 DIAGNOSIS — D631 Anemia in chronic kidney disease: Secondary | ICD-10-CM | POA: Diagnosis not present

## 2019-03-23 DIAGNOSIS — D518 Other vitamin B12 deficiency anemias: Secondary | ICD-10-CM | POA: Diagnosis not present

## 2019-03-23 DIAGNOSIS — D649 Anemia, unspecified: Secondary | ICD-10-CM | POA: Diagnosis not present

## 2019-03-23 DIAGNOSIS — D501 Sideropenic dysphagia: Secondary | ICD-10-CM | POA: Diagnosis not present

## 2019-03-23 DIAGNOSIS — D631 Anemia in chronic kidney disease: Secondary | ICD-10-CM | POA: Diagnosis not present

## 2019-03-23 DIAGNOSIS — I10 Essential (primary) hypertension: Secondary | ICD-10-CM | POA: Diagnosis not present

## 2019-03-23 DIAGNOSIS — N183 Chronic kidney disease, stage 3 (moderate): Secondary | ICD-10-CM | POA: Diagnosis not present

## 2019-03-23 DIAGNOSIS — D509 Iron deficiency anemia, unspecified: Secondary | ICD-10-CM | POA: Diagnosis not present

## 2019-03-27 DIAGNOSIS — Z87891 Personal history of nicotine dependence: Secondary | ICD-10-CM | POA: Diagnosis not present

## 2019-03-27 DIAGNOSIS — G4733 Obstructive sleep apnea (adult) (pediatric): Secondary | ICD-10-CM | POA: Diagnosis not present

## 2019-03-27 DIAGNOSIS — R5383 Other fatigue: Secondary | ICD-10-CM | POA: Diagnosis not present

## 2019-03-27 DIAGNOSIS — J449 Chronic obstructive pulmonary disease, unspecified: Secondary | ICD-10-CM | POA: Diagnosis not present

## 2019-03-28 DIAGNOSIS — E559 Vitamin D deficiency, unspecified: Secondary | ICD-10-CM | POA: Diagnosis not present

## 2019-03-28 DIAGNOSIS — N183 Chronic kidney disease, stage 3 (moderate): Secondary | ICD-10-CM | POA: Diagnosis not present

## 2019-03-28 DIAGNOSIS — I10 Essential (primary) hypertension: Secondary | ICD-10-CM | POA: Diagnosis not present

## 2019-03-28 DIAGNOSIS — N390 Urinary tract infection, site not specified: Secondary | ICD-10-CM | POA: Diagnosis not present

## 2019-03-31 DIAGNOSIS — M1611 Unilateral primary osteoarthritis, right hip: Secondary | ICD-10-CM | POA: Diagnosis not present

## 2019-03-31 DIAGNOSIS — N189 Chronic kidney disease, unspecified: Secondary | ICD-10-CM | POA: Diagnosis not present

## 2019-03-31 DIAGNOSIS — M1711 Unilateral primary osteoarthritis, right knee: Secondary | ICD-10-CM | POA: Diagnosis not present

## 2019-03-31 DIAGNOSIS — J449 Chronic obstructive pulmonary disease, unspecified: Secondary | ICD-10-CM | POA: Diagnosis not present

## 2019-04-03 DIAGNOSIS — G4733 Obstructive sleep apnea (adult) (pediatric): Secondary | ICD-10-CM | POA: Diagnosis not present

## 2019-04-06 DIAGNOSIS — D485 Neoplasm of uncertain behavior of skin: Secondary | ICD-10-CM | POA: Diagnosis not present

## 2019-04-10 DIAGNOSIS — H5203 Hypermetropia, bilateral: Secondary | ICD-10-CM | POA: Diagnosis not present

## 2019-04-10 DIAGNOSIS — H52209 Unspecified astigmatism, unspecified eye: Secondary | ICD-10-CM | POA: Diagnosis not present

## 2019-04-10 DIAGNOSIS — H524 Presbyopia: Secondary | ICD-10-CM | POA: Diagnosis not present

## 2019-05-19 DIAGNOSIS — J9611 Chronic respiratory failure with hypoxia: Secondary | ICD-10-CM | POA: Diagnosis not present

## 2019-05-19 DIAGNOSIS — D509 Iron deficiency anemia, unspecified: Secondary | ICD-10-CM | POA: Diagnosis not present

## 2019-05-19 DIAGNOSIS — M810 Age-related osteoporosis without current pathological fracture: Secondary | ICD-10-CM | POA: Diagnosis not present

## 2019-05-19 DIAGNOSIS — Z9981 Dependence on supplemental oxygen: Secondary | ICD-10-CM | POA: Diagnosis not present

## 2019-05-19 DIAGNOSIS — E7849 Other hyperlipidemia: Secondary | ICD-10-CM | POA: Diagnosis not present

## 2019-05-19 DIAGNOSIS — R7309 Other abnormal glucose: Secondary | ICD-10-CM | POA: Diagnosis not present

## 2019-05-19 DIAGNOSIS — I700 Atherosclerosis of aorta: Secondary | ICD-10-CM | POA: Diagnosis not present

## 2019-05-19 DIAGNOSIS — J449 Chronic obstructive pulmonary disease, unspecified: Secondary | ICD-10-CM | POA: Diagnosis not present

## 2019-05-19 DIAGNOSIS — Z23 Encounter for immunization: Secondary | ICD-10-CM | POA: Diagnosis not present

## 2019-05-19 DIAGNOSIS — N183 Chronic kidney disease, stage 3 unspecified: Secondary | ICD-10-CM | POA: Diagnosis not present

## 2019-05-19 DIAGNOSIS — G629 Polyneuropathy, unspecified: Secondary | ICD-10-CM | POA: Diagnosis not present

## 2019-05-19 DIAGNOSIS — I1310 Hypertensive heart and chronic kidney disease without heart failure, with stage 1 through stage 4 chronic kidney disease, or unspecified chronic kidney disease: Secondary | ICD-10-CM | POA: Diagnosis not present

## 2019-05-19 DIAGNOSIS — J439 Emphysema, unspecified: Secondary | ICD-10-CM | POA: Diagnosis not present

## 2019-05-29 DIAGNOSIS — N183 Chronic kidney disease, stage 3 unspecified: Secondary | ICD-10-CM | POA: Diagnosis not present

## 2019-05-29 DIAGNOSIS — D631 Anemia in chronic kidney disease: Secondary | ICD-10-CM | POA: Diagnosis not present

## 2019-05-29 DIAGNOSIS — I10 Essential (primary) hypertension: Secondary | ICD-10-CM | POA: Diagnosis not present

## 2019-06-01 DIAGNOSIS — E7849 Other hyperlipidemia: Secondary | ICD-10-CM | POA: Diagnosis not present

## 2019-06-01 DIAGNOSIS — E1129 Type 2 diabetes mellitus with other diabetic kidney complication: Secondary | ICD-10-CM | POA: Diagnosis not present

## 2019-06-01 DIAGNOSIS — D509 Iron deficiency anemia, unspecified: Secondary | ICD-10-CM | POA: Diagnosis not present

## 2019-06-01 DIAGNOSIS — Z79899 Other long term (current) drug therapy: Secondary | ICD-10-CM | POA: Diagnosis not present

## 2019-06-01 DIAGNOSIS — N055 Unspecified nephritic syndrome with diffuse mesangiocapillary glomerulonephritis: Secondary | ICD-10-CM | POA: Diagnosis not present

## 2019-06-01 DIAGNOSIS — Z7984 Long term (current) use of oral hypoglycemic drugs: Secondary | ICD-10-CM | POA: Diagnosis not present

## 2019-06-01 DIAGNOSIS — Z0000 Encounter for general adult medical examination without abnormal findings: Secondary | ICD-10-CM | POA: Diagnosis not present

## 2019-06-05 DIAGNOSIS — N183 Chronic kidney disease, stage 3 unspecified: Secondary | ICD-10-CM | POA: Diagnosis not present

## 2019-06-05 DIAGNOSIS — I10 Essential (primary) hypertension: Secondary | ICD-10-CM | POA: Diagnosis not present

## 2019-06-05 DIAGNOSIS — D631 Anemia in chronic kidney disease: Secondary | ICD-10-CM | POA: Diagnosis not present

## 2019-06-12 DIAGNOSIS — D631 Anemia in chronic kidney disease: Secondary | ICD-10-CM | POA: Diagnosis not present

## 2019-06-12 DIAGNOSIS — N183 Chronic kidney disease, stage 3 unspecified: Secondary | ICD-10-CM | POA: Diagnosis not present

## 2019-06-12 DIAGNOSIS — I10 Essential (primary) hypertension: Secondary | ICD-10-CM | POA: Diagnosis not present

## 2019-06-19 DIAGNOSIS — N183 Chronic kidney disease, stage 3 unspecified: Secondary | ICD-10-CM | POA: Diagnosis not present

## 2019-06-19 DIAGNOSIS — D631 Anemia in chronic kidney disease: Secondary | ICD-10-CM | POA: Diagnosis not present

## 2019-06-19 DIAGNOSIS — I10 Essential (primary) hypertension: Secondary | ICD-10-CM | POA: Diagnosis not present

## 2019-06-22 DIAGNOSIS — Z981 Arthrodesis status: Secondary | ICD-10-CM | POA: Diagnosis not present

## 2019-06-22 DIAGNOSIS — M5442 Lumbago with sciatica, left side: Secondary | ICD-10-CM | POA: Diagnosis not present

## 2019-06-22 DIAGNOSIS — R102 Pelvic and perineal pain: Secondary | ICD-10-CM | POA: Diagnosis not present

## 2019-06-22 DIAGNOSIS — R8271 Bacteriuria: Secondary | ICD-10-CM | POA: Diagnosis not present

## 2019-06-22 DIAGNOSIS — G8929 Other chronic pain: Secondary | ICD-10-CM | POA: Diagnosis not present

## 2019-06-22 DIAGNOSIS — M4803 Spinal stenosis, cervicothoracic region: Secondary | ICD-10-CM | POA: Diagnosis not present

## 2019-06-22 DIAGNOSIS — M5441 Lumbago with sciatica, right side: Secondary | ICD-10-CM | POA: Diagnosis not present

## 2019-06-22 DIAGNOSIS — M545 Low back pain: Secondary | ICD-10-CM | POA: Diagnosis not present

## 2019-06-22 DIAGNOSIS — M47816 Spondylosis without myelopathy or radiculopathy, lumbar region: Secondary | ICD-10-CM | POA: Diagnosis not present

## 2019-06-26 DIAGNOSIS — N183 Chronic kidney disease, stage 3 unspecified: Secondary | ICD-10-CM | POA: Diagnosis not present

## 2019-06-26 DIAGNOSIS — D631 Anemia in chronic kidney disease: Secondary | ICD-10-CM | POA: Diagnosis not present

## 2019-06-26 DIAGNOSIS — I10 Essential (primary) hypertension: Secondary | ICD-10-CM | POA: Diagnosis not present

## 2019-07-04 DIAGNOSIS — R5383 Other fatigue: Secondary | ICD-10-CM | POA: Diagnosis not present

## 2019-07-04 DIAGNOSIS — J449 Chronic obstructive pulmonary disease, unspecified: Secondary | ICD-10-CM | POA: Diagnosis not present

## 2019-07-04 DIAGNOSIS — G4733 Obstructive sleep apnea (adult) (pediatric): Secondary | ICD-10-CM | POA: Diagnosis not present

## 2019-07-04 DIAGNOSIS — J301 Allergic rhinitis due to pollen: Secondary | ICD-10-CM | POA: Diagnosis not present

## 2019-07-12 DIAGNOSIS — M810 Age-related osteoporosis without current pathological fracture: Secondary | ICD-10-CM | POA: Diagnosis not present

## 2019-07-12 DIAGNOSIS — E559 Vitamin D deficiency, unspecified: Secondary | ICD-10-CM | POA: Diagnosis not present

## 2019-07-12 DIAGNOSIS — R102 Pelvic and perineal pain: Secondary | ICD-10-CM | POA: Diagnosis not present

## 2019-07-12 DIAGNOSIS — R1031 Right lower quadrant pain: Secondary | ICD-10-CM | POA: Diagnosis not present

## 2019-07-14 DIAGNOSIS — D251 Intramural leiomyoma of uterus: Secondary | ICD-10-CM | POA: Diagnosis not present

## 2019-07-14 DIAGNOSIS — R102 Pelvic and perineal pain: Secondary | ICD-10-CM | POA: Diagnosis not present

## 2019-07-14 DIAGNOSIS — Z90721 Acquired absence of ovaries, unilateral: Secondary | ICD-10-CM | POA: Diagnosis not present

## 2019-07-14 DIAGNOSIS — R1031 Right lower quadrant pain: Secondary | ICD-10-CM | POA: Diagnosis not present

## 2019-07-14 DIAGNOSIS — N950 Postmenopausal bleeding: Secondary | ICD-10-CM | POA: Diagnosis not present

## 2019-07-14 DIAGNOSIS — D259 Leiomyoma of uterus, unspecified: Secondary | ICD-10-CM | POA: Diagnosis not present

## 2019-07-18 DIAGNOSIS — Z13820 Encounter for screening for osteoporosis: Secondary | ICD-10-CM | POA: Diagnosis not present

## 2019-07-18 DIAGNOSIS — M85852 Other specified disorders of bone density and structure, left thigh: Secondary | ICD-10-CM | POA: Diagnosis not present

## 2019-07-18 DIAGNOSIS — Z780 Asymptomatic menopausal state: Secondary | ICD-10-CM | POA: Diagnosis not present

## 2019-07-18 DIAGNOSIS — M810 Age-related osteoporosis without current pathological fracture: Secondary | ICD-10-CM | POA: Diagnosis not present

## 2019-07-18 DIAGNOSIS — Z1231 Encounter for screening mammogram for malignant neoplasm of breast: Secondary | ICD-10-CM | POA: Diagnosis not present

## 2019-07-25 DIAGNOSIS — J439 Emphysema, unspecified: Secondary | ICD-10-CM | POA: Diagnosis not present

## 2019-07-25 DIAGNOSIS — I1310 Hypertensive heart and chronic kidney disease without heart failure, with stage 1 through stage 4 chronic kidney disease, or unspecified chronic kidney disease: Secondary | ICD-10-CM | POA: Diagnosis not present

## 2019-07-25 DIAGNOSIS — E7849 Other hyperlipidemia: Secondary | ICD-10-CM | POA: Diagnosis not present

## 2019-07-25 DIAGNOSIS — M810 Age-related osteoporosis without current pathological fracture: Secondary | ICD-10-CM | POA: Diagnosis not present

## 2019-07-25 DIAGNOSIS — Z9981 Dependence on supplemental oxygen: Secondary | ICD-10-CM | POA: Diagnosis not present

## 2019-07-25 DIAGNOSIS — E559 Vitamin D deficiency, unspecified: Secondary | ICD-10-CM | POA: Diagnosis not present

## 2019-07-25 DIAGNOSIS — D692 Other nonthrombocytopenic purpura: Secondary | ICD-10-CM | POA: Diagnosis not present

## 2019-07-25 DIAGNOSIS — J9611 Chronic respiratory failure with hypoxia: Secondary | ICD-10-CM | POA: Diagnosis not present

## 2019-07-25 DIAGNOSIS — N183 Chronic kidney disease, stage 3 unspecified: Secondary | ICD-10-CM | POA: Diagnosis not present

## 2019-07-31 DIAGNOSIS — D509 Iron deficiency anemia, unspecified: Secondary | ICD-10-CM | POA: Diagnosis not present

## 2019-07-31 DIAGNOSIS — D649 Anemia, unspecified: Secondary | ICD-10-CM | POA: Diagnosis not present

## 2019-07-31 DIAGNOSIS — D631 Anemia in chronic kidney disease: Secondary | ICD-10-CM | POA: Diagnosis not present

## 2019-07-31 DIAGNOSIS — D518 Other vitamin B12 deficiency anemias: Secondary | ICD-10-CM | POA: Diagnosis not present

## 2019-08-05 DIAGNOSIS — R5383 Other fatigue: Secondary | ICD-10-CM | POA: Diagnosis not present

## 2019-08-05 DIAGNOSIS — Z20828 Contact with and (suspected) exposure to other viral communicable diseases: Secondary | ICD-10-CM | POA: Diagnosis not present

## 2019-08-05 DIAGNOSIS — R509 Fever, unspecified: Secondary | ICD-10-CM | POA: Diagnosis not present

## 2021-04-07 LAB — COLOGUARD: COLOGUARD: POSITIVE — AB

## 2023-03-23 DIAGNOSIS — R079 Chest pain, unspecified: Secondary | ICD-10-CM
